# Patient Record
Sex: Male | Born: 1950 | Race: White | Hispanic: No | Marital: Married | Smoking: Current every day smoker
Health system: Southern US, Community
[De-identification: ages and names within clinical notes are randomized; demographics above are authoritative.]

## PROBLEM LIST (undated history)

## (undated) DIAGNOSIS — R011 Cardiac murmur, unspecified: Secondary | ICD-10-CM

## (undated) DIAGNOSIS — I350 Nonrheumatic aortic (valve) stenosis: Secondary | ICD-10-CM

## (undated) DIAGNOSIS — F419 Anxiety disorder, unspecified: Secondary | ICD-10-CM

## (undated) DIAGNOSIS — Z9289 Personal history of other medical treatment: Secondary | ICD-10-CM

## (undated) DIAGNOSIS — R0602 Shortness of breath: Secondary | ICD-10-CM

## (undated) DIAGNOSIS — E079 Disorder of thyroid, unspecified: Secondary | ICD-10-CM

## (undated) DIAGNOSIS — R059 Cough, unspecified: Secondary | ICD-10-CM

## (undated) DIAGNOSIS — F1010 Alcohol abuse, uncomplicated: Secondary | ICD-10-CM

## (undated) DIAGNOSIS — E7800 Pure hypercholesterolemia, unspecified: Secondary | ICD-10-CM

## (undated) DIAGNOSIS — R05 Cough: Secondary | ICD-10-CM

## (undated) DIAGNOSIS — I10 Essential (primary) hypertension: Secondary | ICD-10-CM

## (undated) DIAGNOSIS — D759 Disease of blood and blood-forming organs, unspecified: Secondary | ICD-10-CM

## (undated) HISTORY — DX: Nonrheumatic aortic (valve) stenosis: I35.0

## (undated) HISTORY — DX: Personal history of other medical treatment: Z92.89

## (undated) HISTORY — PX: THYROIDECTOMY, PARTIAL: SHX18

## (undated) HISTORY — PX: FRACTURE SURGERY: SHX138

## (undated) HISTORY — PX: CARDIAC VALVE REPLACEMENT: SHX585

## (undated) HISTORY — DX: Disorder of thyroid, unspecified: E07.9

---
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
## 2010-03-13 ENCOUNTER — Other Ambulatory Visit (HOSPITAL_COMMUNITY): Payer: Self-pay | Admitting: Family Medicine

## 2010-03-13 ENCOUNTER — Other Ambulatory Visit: Payer: Self-pay | Admitting: Family Medicine

## 2010-03-13 ENCOUNTER — Ambulatory Visit (HOSPITAL_COMMUNITY)
Admission: RE | Admit: 2010-03-13 | Discharge: 2010-03-13 | Disposition: A | Discharge status: Home / Self Care | Payer: 59 | Source: Ambulatory Visit | Attending: Family Medicine | Admitting: Family Medicine

## 2010-03-13 DIAGNOSIS — R221 Localized swelling, mass and lump, neck: Secondary | ICD-10-CM

## 2010-03-13 DIAGNOSIS — R220 Localized swelling, mass and lump, head: Secondary | ICD-10-CM | POA: Insufficient documentation

## 2010-03-14 ENCOUNTER — Ambulatory Visit (HOSPITAL_COMMUNITY)
Admission: RE | Admit: 2010-03-14 | Discharge: 2010-03-14 | Disposition: A | Discharge status: Home / Self Care | Payer: 59 | Source: Ambulatory Visit | Attending: Family Medicine | Admitting: Family Medicine

## 2010-03-14 DIAGNOSIS — E041 Nontoxic single thyroid nodule: Secondary | ICD-10-CM | POA: Insufficient documentation

## 2010-03-14 DIAGNOSIS — R221 Localized swelling, mass and lump, neck: Secondary | ICD-10-CM

## 2010-03-14 DIAGNOSIS — R220 Localized swelling, mass and lump, head: Secondary | ICD-10-CM | POA: Insufficient documentation

## 2010-03-14 MED ORDER — IOHEXOL 300 MG/ML  SOLN
100.0000 mL | Freq: Once | INTRAMUSCULAR | Status: AC | PRN
  Administered 2010-03-14: 100 mL via INTRAVENOUS

## 2010-03-16 ENCOUNTER — Other Ambulatory Visit: Payer: Self-pay | Admitting: Otolaryngology

## 2010-03-16 ENCOUNTER — Other Ambulatory Visit (HOSPITAL_COMMUNITY)
Admission: RE | Admit: 2010-03-16 | Discharge: 2010-03-16 | Disposition: A | Discharge status: Home / Self Care | Payer: 59 | Source: Ambulatory Visit | Attending: Otolaryngology | Admitting: Otolaryngology

## 2010-03-16 DIAGNOSIS — E049 Nontoxic goiter, unspecified: Secondary | ICD-10-CM | POA: Insufficient documentation

## 2010-03-21 ENCOUNTER — Other Ambulatory Visit: Payer: Self-pay | Admitting: Otolaryngology

## 2010-03-21 DIAGNOSIS — E041 Nontoxic single thyroid nodule: Secondary | ICD-10-CM

## 2010-03-27 ENCOUNTER — Ambulatory Visit (HOSPITAL_COMMUNITY)
Admission: RE | Admit: 2010-03-27 | Discharge: 2010-03-27 | Disposition: A | Discharge status: Home / Self Care | Payer: 59 | Source: Ambulatory Visit | Attending: Otolaryngology | Admitting: Otolaryngology

## 2010-03-27 ENCOUNTER — Other Ambulatory Visit: Payer: Self-pay | Admitting: Interventional Radiology

## 2010-03-27 DIAGNOSIS — E041 Nontoxic single thyroid nodule: Secondary | ICD-10-CM | POA: Insufficient documentation

## 2010-04-13 ENCOUNTER — Encounter (HOSPITAL_COMMUNITY)
Admission: RE | Admit: 2010-04-13 | Discharge: 2010-04-13 | Disposition: A | Discharge status: Home / Self Care | Payer: 59 | Source: Ambulatory Visit | Attending: Otolaryngology | Admitting: Otolaryngology

## 2010-04-13 LAB — CBC
Platelets: 219 10*3/uL (ref 150–400)
RBC: 5.17 MIL/uL (ref 4.22–5.81)
WBC: 9.4 10*3/uL (ref 4.0–10.5)

## 2010-04-13 LAB — SURGICAL PCR SCREEN: Staphylococcus aureus: NEGATIVE

## 2010-04-19 ENCOUNTER — Observation Stay (HOSPITAL_COMMUNITY)
Admission: RE | Admit: 2010-04-19 | Discharge: 2010-04-20 | Disposition: A | Discharge status: Home / Self Care | Payer: 59 | Source: Ambulatory Visit | Attending: Otolaryngology | Admitting: Otolaryngology

## 2010-04-19 ENCOUNTER — Other Ambulatory Visit: Payer: Self-pay | Admitting: Otolaryngology

## 2010-04-19 DIAGNOSIS — Z01812 Encounter for preprocedural laboratory examination: Secondary | ICD-10-CM | POA: Insufficient documentation

## 2010-04-19 DIAGNOSIS — E042 Nontoxic multinodular goiter: Principal | ICD-10-CM | POA: Insufficient documentation

## 2010-04-24 NOTE — Op Note (Signed)
Benjamin Spears, Benjamin Spears             ACCOUNT NO.:  [id number]  MEDICAL RECORD NO.:  [id number]           PATIENT TYPE:  O  LOCATION:  5114                         FACILITY:  [facility]  PHYSICIAN:  Benjamin Kathan H. Pollyann Kennedy, MD     DATE OF BIRTH:  1950/03/21  DATE OF PROCEDURE:  04/19/2010 DATE OF DISCHARGE:                              OPERATIVE REPORT   REFERRING PHYSICIAN:  Francis P. Modesto Charon, MD  PREOPERATIVE DIAGNOSIS:  Left thyroid mass.  POSTOPERATIVE DIAGNOSIS:  Left thyroid mass.  PROCEDURE:  Left thyroid lobectomy.  SURGEON:  Benjamin Sherard H. Pollyann Kennedy, MD  ASSISTANT:  Benjamin Spears, [facility]  ANESTHESIA:  General endotracheal anesthesia was used.  COMPLICATIONS:  None.  BLOOD LOSS:  500 mL.  FINDINGS:  Large multilobulated left thyroid lobe with dominant nodule. Frozen section evaluation, no papillary cancer.  Further diagnosis deferred for permanent evaluation.  No complications.  HISTORY:  This is a 60 year old with a lump in the right neck that turned out to be a parotid mass for which she went and had an ultrasound and CT of the neck which then revealed an incidental 3-cm calcified lesion in the left thyroid.  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  In the interim, the parotid mass had disappeared after being treated with antibiotics. The thyroid mass persists.  Risks, benefits, alternatives, and complications of the procedure were explained to the patient, seemed to understand, and agreed to surgery.  PROCEDURE:  The patient was taken to the operating room, placed on the operating table in supine position.  Following induction of general endotracheal anesthesia, the neck was prepped and draped in standard fashion.  Thorough palpation of the right parotid revealed no palpable mass.  A low collar incision was outlined with a marking pen about 6 cm in length and was incised just above the clavicle and sternal notch with an electrocautery  unit.  Dissection was carried down through the superficial fascia and the platysma muscle.  Subplatysmal flaps were elevated superiorly and inferiorly to the thyroid notch and the clavicle.  Self-retaining thyroid retractor was used through the rest of the case.  The midline fascia was divided and the diastasis of the strap muscles was identified.  The left-sided strap muscles were reflected off laterally while the thyroid gland left lobe was exposed.  A combination of Babcock and Allis forceps were used to retract the left lobe medially.  Dissection was started in the superior pole where the superior vasculature was separately identified, ligated between clamps, and divided.  The dissection remained on the capsule of the gland the entire time.  Parathyroid glands were not identified.  The middle thyroid vein was divided between clamps and ligated with silk suture as well.  As the grand was brought forward, the recurrent nerve was identified and was left unmolested.  The inferior vasculature was then also separately ligated between clamps and divided.  A 4-0 silk ties were used throughout the case.  The bipolar cautery was used as needed for maintenance of hemostasis.  Berry ligament was divided using electrocautery, and the gland was brought off the face of the  trachea. The isthmus was divided on the right of the midline using electrocautery.  Ties were used on larger vessels.  The left lobe was sent for pathologic evaluation as described above.  The wound was irrigated with saline.  Hemostasis was completed.  The wound was closed in layers using 4-0 chromic on the midline fascia of the platysma layer and the subcutaneous layer.  Dermabond was used on the skin.  A 7-French round JP drain was left in the wound, exited through the right side of the incision, secured in place with a nylon suture.  The patient was awakened, extubated, and transferred to recovery in stable  condition.     Benjamin Spears H. Pollyann Kennedy, MD     JHR/MEDQ  D:  04/19/2010  T:  04/20/2010  Job:  [id number]  Electronically Signed by Benjamin Colonel MD on 04/24/2010 08:11:53 PM

## 2013-01-19 IMAGING — CT CT NECK W/ CM
3 series · 13 of 20 positions shown, 15 images · IV contrast (omnipaque)
Comparison: None

CLINICAL DATA: Right neck mass.

CT NECK WITH CONTRAST
TECHNIQUE: Multidetector CT imaging of the neck was performed with
intravenous contrast.
Contrast: 100 ml Omnipaque 300 IV.

[Series 2: neck rtn st · axial · 0.39mm/px · z∈[+854,+1030]mm · 5 of 89 slices shown]
[im 15/89  bone]
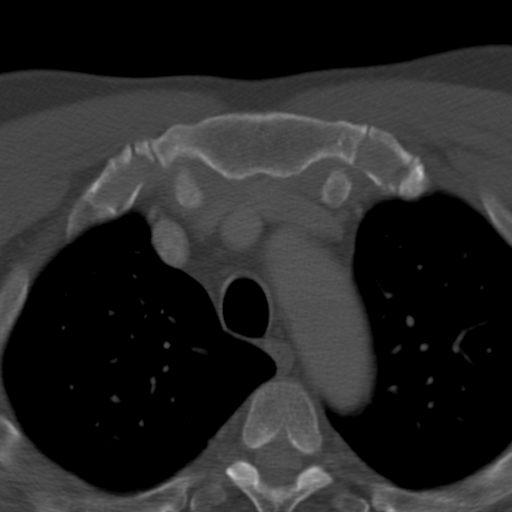
[im 30/89  bone]
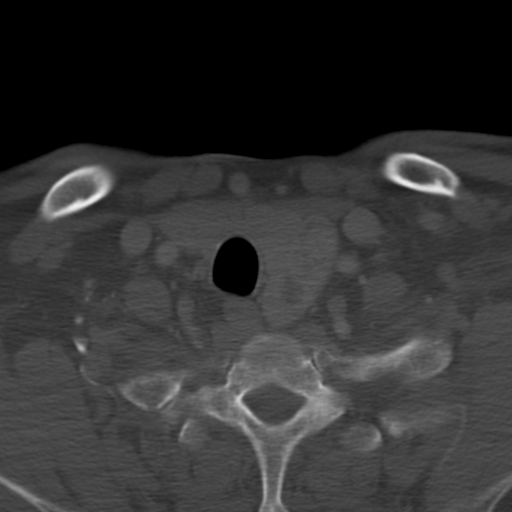
[im 45/89  bone]
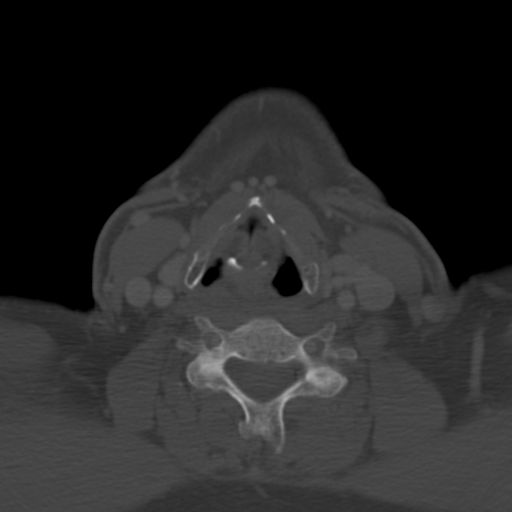
[im 59/89  bone]
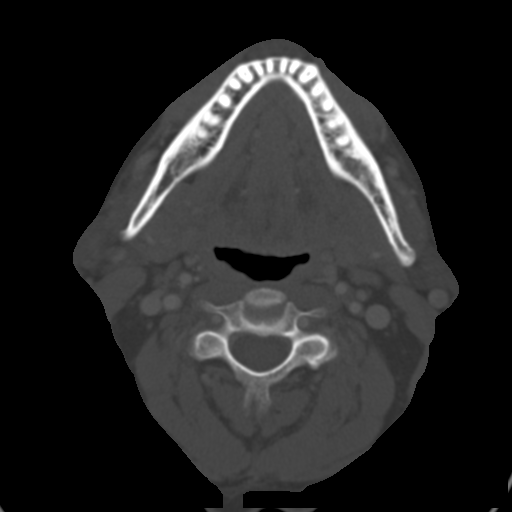
[im 74/89  bone]
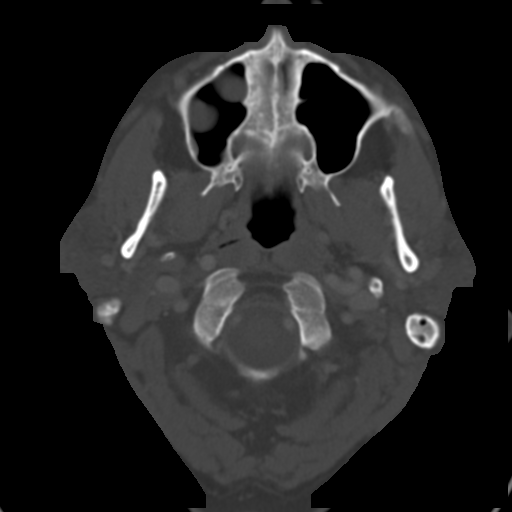

[Series 602: <mpr thick range> · coronal · 0.52mm/px · 3 of 66 slices shown]
[im 15/66  bone]
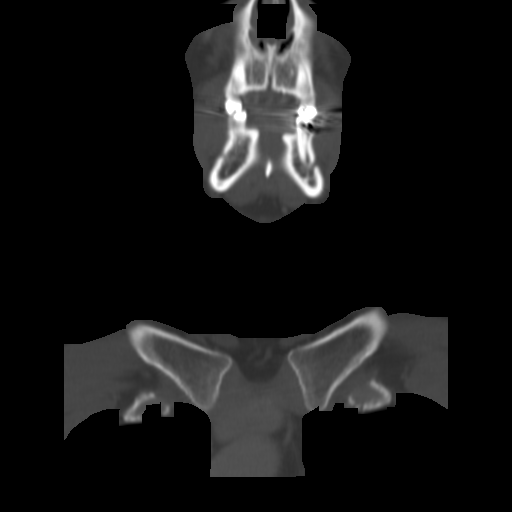
[im 27/66  bone]
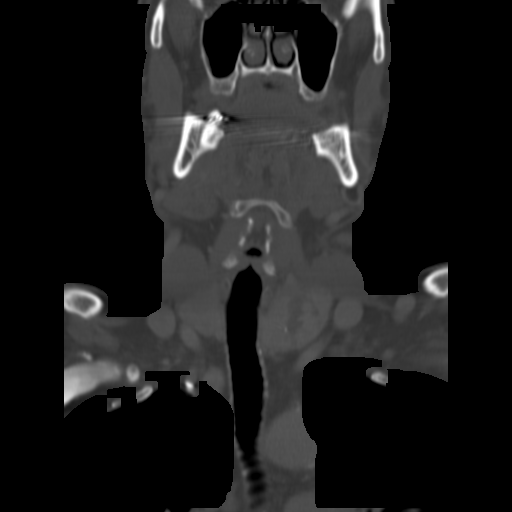
[im 39/66  bone]
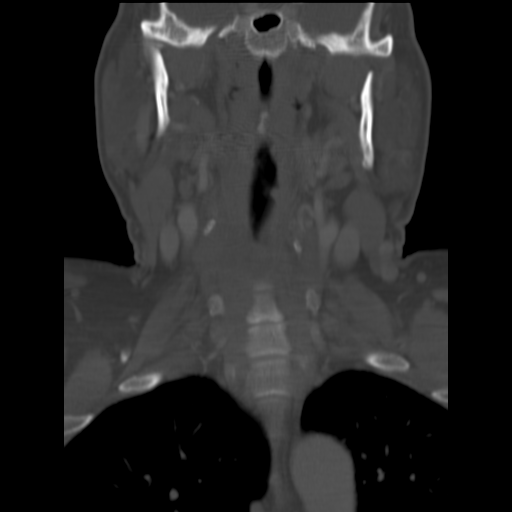

[Series 603: <mpr thick range(1)> · axial · 0.52mm/px · z∈[+812,+963]mm · 5 of 79 slices shown, 7 images]
[im 14/79  soft-tissue]
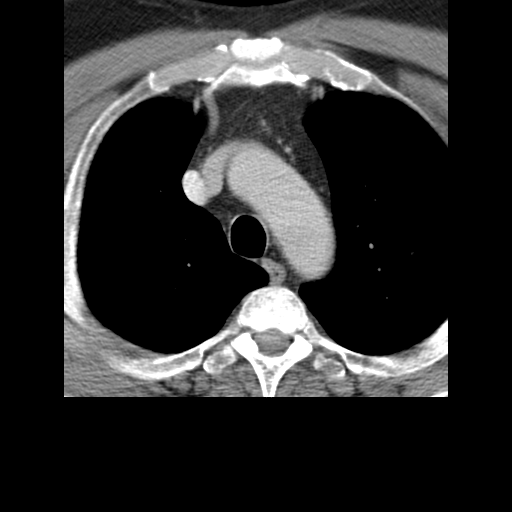
[im 14/79  bone]
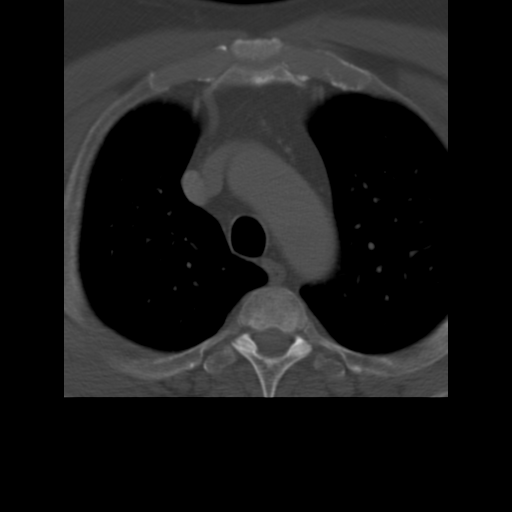
[im 27/79  bone]
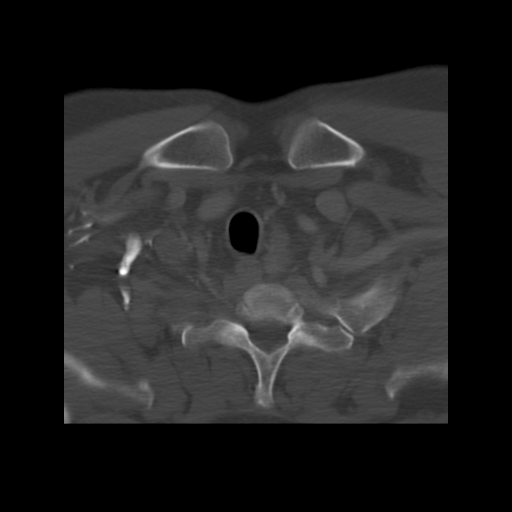
[im 40/79  bone]
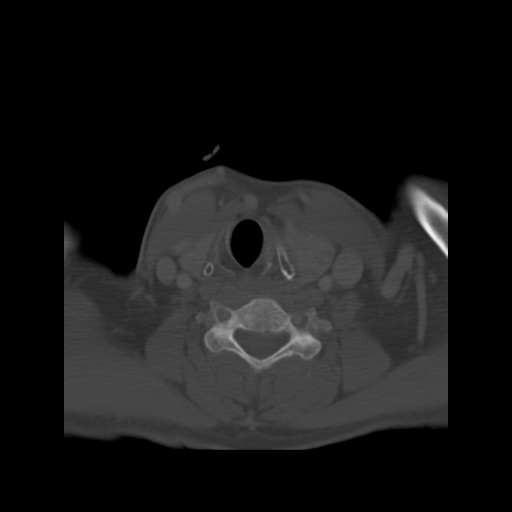
[im 53/79  bone]
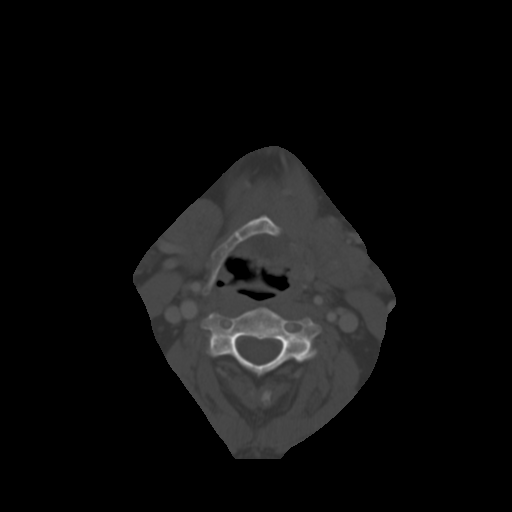
[im 66/79  soft-tissue]
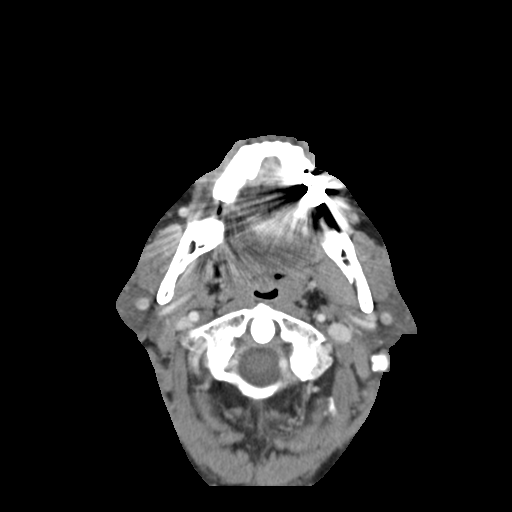
[im 66/79  bone]
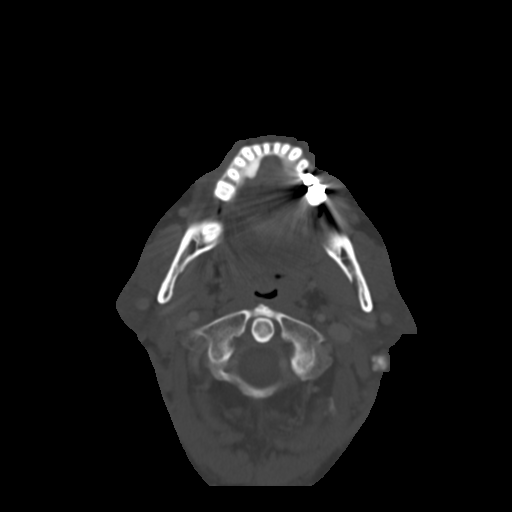

[13 of 20 positions shown; findings below may reference images not displayed]

FINDINGS: No neck masses are identified.  Parotid glands and
submandibular glands are symmetric and unremarkable.  Minimal
atherosclerotic plaque in the carotid bulb without stenosis.
Jugular veins are patent.  Retropharyngeal soft tissues are normal.
Airway is patent.  Scattered small lymph nodes, none pathologically
enlarged.

3 cm irregular complex mass is seen within the left thyroid lobe
containing internal calcifications.  Right thyroid lobe is
unremarkable.

Mucoperiosteal thickening in the right maxillary sinus.  Remainder
the paranasal sinuses are clear.  No air fluid levels.  Visualized
orbital soft tissues are unremarkable.
IMPRESSION: No abnormality seen within the neck to explain the palpable right
neck mass.

Left thyroid nodule or nodules containing calcifications.  This
could be further characterized with dedicated thyroid ultrasound.

Minimal chronic sinusitis changes.

## 2013-05-27 ENCOUNTER — Ambulatory Visit (INDEPENDENT_AMBULATORY_CARE_PROVIDER_SITE_OTHER): Payer: 59

## 2013-05-27 ENCOUNTER — Encounter (INDEPENDENT_AMBULATORY_CARE_PROVIDER_SITE_OTHER): Payer: Self-pay

## 2013-05-27 ENCOUNTER — Ambulatory Visit (INDEPENDENT_AMBULATORY_CARE_PROVIDER_SITE_OTHER): Payer: 59 | Admitting: Physician Assistant

## 2013-05-27 ENCOUNTER — Encounter: Payer: Self-pay | Admitting: Physician Assistant

## 2013-05-27 VITALS — BP 166/88 | HR 58 | Temp 97.7°F | Ht 74.0 in | Wt 249.0 lb

## 2013-05-27 DIAGNOSIS — Z0000 Encounter for general adult medical examination without abnormal findings: Secondary | ICD-10-CM

## 2013-05-27 DIAGNOSIS — R011 Cardiac murmur, unspecified: Secondary | ICD-10-CM | POA: Insufficient documentation

## 2013-05-27 NOTE — Progress Notes (Signed)
Subjective:     Patient ID: Benjamin Spears, male   DOB: 01/21/1950, 63 y.o.   MRN: [id number]  HPI Pt here for PE  It has been extended amt of time since his prev PE   Review of Systems  Constitutional: Positive for activity change and fatigue.  HENT: Negative.   Eyes: Positive for visual disturbance.  Respiratory: Positive for cough. Negative for apnea, chest tightness, shortness of breath and wheezing.   Cardiovascular: Negative.   Gastrointestinal: Negative.   Endocrine: Negative.   Genitourinary: Negative.   Musculoskeletal: Positive for arthralgias and back pain.  Skin: Negative.   Psychiatric/Behavioral: Positive for agitation. Negative for hallucinations, behavioral problems, confusion, sleep disturbance and self-injury. The patient is not nervous/anxious and is not hyperactive.   All other systems reviewed and are negative.      Objective:   Physical Exam  Nursing note and vitals reviewed. Constitutional: He is oriented to person, place, and time. He appears well-developed and well-nourished.  HENT:  Head: Normocephalic and atraumatic.  Right Ear: External ear normal.  Left Ear: External ear normal.  Nose: Nose normal.  Mouth/Throat: Oropharynx is clear and moist.  Eyes: Conjunctivae and EOM are normal. Pupils are equal, round, and reactive to light.  Neck: Normal range of motion. Neck supple.  Cardiovascular: Normal rate, regular rhythm and intact distal pulses.   Murmur heard. Sys ejection murmur 3/6  Pulmonary/Chest: Effort normal and breath sounds normal. He has no wheezes. He has no rales.  Abdominal: Soft. Bowel sounds are normal. He exhibits no distension and no mass. There is no tenderness. There is no guarding.  Genitourinary: Penis normal.  Musculoskeletal: Normal range of motion.  Neurological: He is alert and oriented to person, place, and time. He has normal reflexes.  Skin: Skin is warm and dry.  Psychiatric: He has a normal mood and affect. His  behavior is normal. Judgment and thought content normal.  EKG- see labs CXR done today due to 2ppd smoking show chronic changes     Assessment:     Physical exam    Plan:     Full labs pending to include TSH since prev part thyroidectomy Test level due to fatigue Echo due to murmur Smoking cessation discussed Pt due for colonoscopy given FH Will inform of lab results F/U pending labs

## 2013-05-27 NOTE — Patient Instructions (Signed)

## 2013-05-28 LAB — BMP8+EGFR
BUN / CREAT RATIO: 22 (ref 10–22)
BUN: 24 mg/dL (ref 8–27)
CALCIUM: 9.5 mg/dL (ref 8.6–10.2)
CO2: 21 mmol/L (ref 18–29)
CREATININE: 1.1 mg/dL (ref 0.76–1.27)
Chloride: 101 mmol/L (ref 97–108)
GFR, EST AFRICAN AMERICAN: 84 mL/min/{1.73_m2} (ref >59)
GFR, EST NON AFRICAN AMERICAN: 73 mL/min/{1.73_m2} (ref >59)
Glucose: 97 mg/dL (ref 65–99)
POTASSIUM: 4.5 mmol/L (ref 3.5–5.2)
SODIUM: 138 mmol/L (ref 134–144)

## 2013-05-28 LAB — PSA, TOTAL AND FREE
PSA FREE: 0.11 ng/mL
PSA, Free Pct: 36.7 %
PSA: 0.3 ng/mL (ref 0.0–4.0)

## 2013-05-28 LAB — HEPATIC FUNCTION PANEL
ALBUMIN: 4.8 g/dL (ref 3.6–4.8)
ALK PHOS: 76 IU/L (ref 39–117)
ALT: 21 IU/L (ref 0–44)
AST: 19 IU/L (ref 0–40)
Bilirubin, Direct: 0.25 mg/dL (ref 0.00–0.40)
Total Bilirubin: 1 mg/dL (ref 0.0–1.2)
Total Protein: 6.6 g/dL (ref 6.0–8.5)

## 2013-05-28 LAB — THYROID PANEL WITH TSH
Free Thyroxine Index: 1.5 (ref 1.2–4.9)
T3 UPTAKE RATIO: 29 % (ref 24–39)
T4, Total: 5.1 ug/dL (ref 4.5–12.0)
TSH: 1.88 u[IU]/mL (ref 0.450–4.500)

## 2013-05-28 LAB — LIPID PANEL
CHOL/HDL RATIO: 3.3 ratio (ref 0.0–5.0)
Cholesterol, Total: 186 mg/dL (ref 100–199)
HDL: 57 mg/dL (ref >39)
LDL CALC: 99 mg/dL (ref 0–99)
Triglycerides: 151 mg/dL — ABNORMAL HIGH (ref 0–149)
VLDL CHOLESTEROL CAL: 30 mg/dL (ref 5–40)

## 2013-05-28 LAB — TESTOSTERONE,FREE AND TOTAL
TESTOSTERONE FREE: 6.6 pg/mL (ref 6.6–18.1)
Testosterone: 272 ng/dL — ABNORMAL LOW (ref 348–1197)

## 2013-06-05 ENCOUNTER — Encounter: Payer: Self-pay | Admitting: Internal Medicine

## 2013-06-12 ENCOUNTER — Ambulatory Visit (HOSPITAL_COMMUNITY): Payer: 59 | Attending: Cardiology | Admitting: Radiology

## 2013-06-12 DIAGNOSIS — I10 Essential (primary) hypertension: Secondary | ICD-10-CM | POA: Insufficient documentation

## 2013-06-12 DIAGNOSIS — R011 Cardiac murmur, unspecified: Secondary | ICD-10-CM | POA: Insufficient documentation

## 2013-06-12 DIAGNOSIS — I059 Rheumatic mitral valve disease, unspecified: Secondary | ICD-10-CM | POA: Insufficient documentation

## 2013-06-12 DIAGNOSIS — R5383 Other fatigue: Secondary | ICD-10-CM

## 2013-06-12 DIAGNOSIS — R5381 Other malaise: Secondary | ICD-10-CM | POA: Insufficient documentation

## 2013-06-12 DIAGNOSIS — I350 Nonrheumatic aortic (valve) stenosis: Secondary | ICD-10-CM

## 2013-06-12 NOTE — Progress Notes (Signed)
Echocardiogram performed.  

## 2013-06-16 NOTE — Addendum Note (Signed)
Addended by: Inis Sizer on: 06/16/2013 03:37 PM   Modules accepted: Orders

## 2013-06-17 ENCOUNTER — Other Ambulatory Visit: Payer: Self-pay

## 2013-06-17 DIAGNOSIS — I350 Nonrheumatic aortic (valve) stenosis: Secondary | ICD-10-CM

## 2013-06-17 DIAGNOSIS — R011 Cardiac murmur, unspecified: Secondary | ICD-10-CM

## 2013-06-30 ENCOUNTER — Encounter: Payer: Self-pay | Admitting: Cardiovascular Disease

## 2013-06-30 ENCOUNTER — Ambulatory Visit (INDEPENDENT_AMBULATORY_CARE_PROVIDER_SITE_OTHER): Payer: 59 | Admitting: Cardiovascular Disease

## 2013-06-30 VITALS — BP 130/90 | HR 70 | Ht 74.0 in | Wt 242.0 lb

## 2013-06-30 DIAGNOSIS — F17200 Nicotine dependence, unspecified, uncomplicated: Secondary | ICD-10-CM

## 2013-06-30 DIAGNOSIS — Z720 Tobacco use: Secondary | ICD-10-CM

## 2013-06-30 DIAGNOSIS — I350 Nonrheumatic aortic (valve) stenosis: Secondary | ICD-10-CM | POA: Insufficient documentation

## 2013-06-30 DIAGNOSIS — I359 Nonrheumatic aortic valve disorder, unspecified: Secondary | ICD-10-CM

## 2013-06-30 NOTE — Progress Notes (Signed)
History of Present Illness: 63 yo male with history of tobacco abuse (ongoing), thyroid mass s/p thyroidectomy and new diagnosis of aortic valve stenosis here today as a new patient for evaluation of aortic stenosis. He tells me that he is very active. 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 He has no prior cardiac conditions. He has had no chest pain. He does not more frequent fatigue with exertion. He had one episode of severe dizziness with pre-syncope several weeks ago. No dyspnea. He is a current everyday smoker, 2ppd. No evidence of LE edema, orthopnea.   Primary Care Physician: Rudi HeapDonald Moore  Last Lipid Profile:Lipid Panel     Component Value Date/Time   TRIG 151* 05/27/2013 1020   HDL 57 05/27/2013 1020   CHOLHDL 3.3 05/27/2013 1020   LDLCALC 99 05/27/2013 1020    Past Medical History  Diagnosis Date  . Thyroid disease   . Aortic stenosis     Past Surgical History  Procedure Laterality Date  . Thyroidectomy, partial      No current outpatient prescriptions on file.   No current facility-administered medications for this visit.    No Known Allergies  History   Social History  . Marital Status: Married    Spouse Name: N/A    Number of Children: N/A  . Years of Education: N/A   Occupational History  . 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 Smoking status: Current Every Day Smoker -- 2.00 packs/day  . Smokeless tobacco: Never Used  . Alcohol Use: 7.2 oz/week    12 Cans of beer per week  . Drug Use: No  . Sexual Activity: Not on file   Other Topics Concern  . Not on file   Social History Narrative  . No narrative on file    Family History  Problem Relation Age of Onset  . Heart disease Mother     CHF  . Atrial fibrillation Mother   . Cancer Mother     colon    Review of Systems:  As stated in the HPI and otherwise negative.   BP 130/90  Pulse 70  Ht 6\' 2"  (1.88 m)  Wt 242 lb (109.77 kg)  BMI  31.06 kg/m2  Physical Examination: General: Well developed, well nourished, NAD HEENT: OP clear, mucus membranes moist SKIN: warm, dry. No rashes. Neuro: No focal deficits Musculoskeletal: Muscle strength 5/5 all ext Psychiatric: Mood and affect normal Neck: No JVD, no carotid bruits, no thyromegaly, no lymphadenopathy. Lungs:Clear bilaterally, no wheezes, rhonci, crackles Cardiovascular: Regular rate and rhythm. Harsh systolic murmur.  Abdomen:Soft. Bowel sounds present. Non-tender.  Extremities: No lower extremity edema. Pulses are 2 + in the bilateral DP/PT.  EKG: Sinus brady  Echo 06/12/13: Left ventricle: The cavity size was normal. Wall thickness was increased in a pattern of mild LVH. Systolic function was normal. The estimated ejection fraction was in the range of 50% to 55%. Wall motion was normal; there were no regional wall motion abnormalities. Doppler parameters are consistent with abnormal left ventricular relaxation (grade 1 diastolic dysfunction). - Aortic valve: Valve mobility was restricted. There was severe stenosis. There was trivial regurgitation. Valve area (VTI): 0.76 cm^2. Valve area (Vmax): 0.83 cm^2. - Aortic root: The aortic root was mildly dilated. - Mitral valve: There was mild regurgitation. - Left atrium: The atrium was moderately to severely dilated. Impressions:  - Normal LV function; calcified aortic valve; no short  axis views obtained; severe AS with mean gradient of 62 mmHg.  Assessment and Plan:   1. Aortic valve stenosis: Severe aortic valve stenosis by echo June 2015. He has no symptoms. He is very active. Will arrange exercise treadmill stress test to assess for drop in BP. Will repeat echo in 6 months. I will see him back then. He will call with any changes.

## 2013-06-30 NOTE — Patient Instructions (Signed)
Your physician wants you to follow-up in:  6 months. You will receive a reminder letter in the mail two months in advance. If you don't receive a letter, please call our office to schedule the follow-up appointment.  Week or so after echo  Your physician has requested that you have an exercise tolerance test. For further information please visit [email or web address] Please also follow instruction sheet, as given.   Your physician has requested that you have an echocardiogram. Echocardiography is a painless test that uses sound waves to create images of your heart. It provides your doctor with information about the size and shape of your heart and how well your heart's chambers and valves are working. This procedure takes approximately one hour. There are no restrictions for this procedure. To be done in 6 months.

## 2013-07-23 ENCOUNTER — Telehealth (HOSPITAL_COMMUNITY): Payer: Self-pay

## 2013-07-23 NOTE — Telephone Encounter (Signed)
Encounter complete. 

## 2013-07-24 ENCOUNTER — Telehealth (HOSPITAL_COMMUNITY): Payer: Self-pay

## 2013-07-24 NOTE — Telephone Encounter (Signed)
Encounter complete. 

## 2013-07-28 ENCOUNTER — Ambulatory Visit (HOSPITAL_COMMUNITY)
Admission: RE | Admit: 2013-07-28 | Discharge: 2013-07-28 | Disposition: A | Discharge status: Home / Self Care | Payer: 59 | Source: Ambulatory Visit | Attending: Cardiovascular Disease | Admitting: Cardiovascular Disease

## 2013-07-28 DIAGNOSIS — I350 Nonrheumatic aortic (valve) stenosis: Secondary | ICD-10-CM

## 2013-07-28 DIAGNOSIS — I359 Nonrheumatic aortic valve disorder, unspecified: Secondary | ICD-10-CM | POA: Insufficient documentation

## 2013-07-28 NOTE — Procedures (Signed)
Exercise Treadmill Test   Test  Exercise Tolerance Test Ordering MD: Verne Carrowhristopher McAlhany, MD  Interpreting MD:   Unique Test No: 1  Treadmill:  1  Indication for ETT: chest pain - rule out ischemia  Contraindication to ETT: Yes   Stress Modality: exercise - treadmill  Cardiac Imaging Performed: non   Protocol: standard Bruce - maximal  Max BP: 181/103  Max MPHR (bpm):  159 85% MPR (bpm):  135  MPHR obtained (bpm): 139 % MPHR obtained:  87  Reached 85% MPHR (min:sec): 9:15 Total Exercise Time (min-sec): 9:34  Workload in METS:  11.0 Borg Scale:   Reason ETT Terminated:  fatigue    ST Segment Analysis At Rest: normal ST segments - no evidence of significant ST depression With Exercise: borderline ST changes  Other Information Arrhythmia:  PVC's Angina during ETT:  absent (0) Quality of ETT:  diagnostic  ETT Interpretation:  borderline (indeterminate) with non-specific ST changes  Comments: 1-2 mm Horizontal to upsloping ST depression inferiorly at peak stress Good exercise tolerance Diastolic hypertension with exercise PVC's noted throughout the study Duke treadmill score -1 Clinical correlation advised - further testing may be necessary  Chrystie NoseKenneth C. Hilty, MD, [facility] HospitalFACC Attending Cardiologist [facility]

## 2013-07-30 ENCOUNTER — Telehealth: Payer: Self-pay | Admitting: Cardiovascular Disease

## 2013-07-30 NOTE — Telephone Encounter (Signed)
Spoke with pt who is asking for medication to help with nervousness and stress. I told him Dr. Clifton JamesMcAlhany did not usually prescribe these medications and I asked pt to contact his primary care physician for this.

## 2013-07-30 NOTE — Telephone Encounter (Signed)
Patient stated he had just spoke with you and forgot to ask you a couple of questions please call and advise.

## 2013-08-04 ENCOUNTER — Ambulatory Visit (INDEPENDENT_AMBULATORY_CARE_PROVIDER_SITE_OTHER): Payer: 59 | Admitting: Cardiovascular Disease

## 2013-08-04 ENCOUNTER — Encounter: Payer: Self-pay | Admitting: *Deleted

## 2013-08-04 ENCOUNTER — Encounter: Payer: Self-pay | Admitting: Cardiovascular Disease

## 2013-08-04 VITALS — BP 150/90 | HR 86 | Ht 74.0 in | Wt 245.0 lb

## 2013-08-04 DIAGNOSIS — R9439 Abnormal result of other cardiovascular function study: Secondary | ICD-10-CM

## 2013-08-04 DIAGNOSIS — I350 Nonrheumatic aortic (valve) stenosis: Secondary | ICD-10-CM

## 2013-08-04 DIAGNOSIS — F17200 Nicotine dependence, unspecified, uncomplicated: Secondary | ICD-10-CM

## 2013-08-04 DIAGNOSIS — Z720 Tobacco use: Secondary | ICD-10-CM

## 2013-08-04 DIAGNOSIS — I359 Nonrheumatic aortic valve disorder, unspecified: Secondary | ICD-10-CM

## 2013-08-04 DIAGNOSIS — F411 Generalized anxiety disorder: Secondary | ICD-10-CM

## 2013-08-04 DIAGNOSIS — F419 Anxiety disorder, unspecified: Secondary | ICD-10-CM

## 2013-08-04 MED ORDER — ALPRAZOLAM 0.25 MG PO TABS: 0.2500 mg | ORAL_TABLET | Freq: Three times a day (TID) | ORAL | Status: DC | PRN

## 2013-08-04 NOTE — Progress Notes (Signed)
History of Present Illness: 63 yo male with history of tobacco abuse (ongoing), thyroid mass s/p thyroidectomy and severe aortic valve stenosis here today for cardiac follow up. I saw him as a new patient 06/30/13 for evaluation of aortic stenosis. He is a very active gentleman. 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 He has no known prior cardiac conditions. He has had no chest pain. He did describe more frequent fatigue with exertion. He had one episode of severe dizziness with pre-syncope in the beginning of June 2015. No dyspnea. He is a current everyday smoker, 2ppd. No evidence of LE edema, orthopnea. I arranged an exercise stress test to look for BP response to exercise with severe AS which seemed to be asymptomatic. Exercise stress test 07/28/13 with good exercise tolerance, no chest pain but diffuse ST depression c/w ischemia.   He is here today for follow up. Feeling well. No chest pain or SOB. Describes anxiety. Lots of stress with family.   Primary Care Physician: [name]  Last Lipid Profile:Lipid Panel     Component Value Date/Time   TRIG 151* 05/27/2013 1020   HDL 57 05/27/2013 1020   CHOLHDL 3.3 05/27/2013 1020   LDLCALC 99 05/27/2013 1020    Past Medical History  Diagnosis Date  . Thyroid disease   . Aortic stenosis     Past Surgical History  Procedure Laterality Date  . Thyroidectomy, partial      No current outpatient prescriptions on file.   No current facility-administered medications for this visit.    No Known Allergies  History   Social History  . Marital Status: Married    Spouse Name: N/A    Number of Children: N/A  . Years of Education: N/A   Occupational History  . 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 Smoking status: Current Every Day Smoker -- 2.00 packs/day  . Smokeless tobacco: Never Used  . Alcohol Use: 7.2 oz/week    12 Cans of beer per week  . Drug Use: No  . Sexual Activity: Not  on file   Other Topics Concern  . Not on file   Social History Narrative  . No narrative on file    Family History  Problem Relation Age of Onset  . Heart disease Mother     CHF  . Atrial fibrillation Mother   . Cancer Mother     colon    Review of Systems:  As stated in the HPI and otherwise negative.   There were no vitals taken for this visit.  Physical Examination: General: Well developed, well nourished, NAD HEENT: OP clear, mucus membranes moist SKIN: warm, dry. No rashes. Neuro: No focal deficits Musculoskeletal: Muscle strength 5/5 all ext Psychiatric: Mood and affect normal Neck: No JVD, no carotid bruits, no thyromegaly, no lymphadenopathy. Lungs:Clear bilaterally, no wheezes, rhonci, crackles Cardiovascular: Regular rate and rhythm. Harsh systolic murmur.  Abdomen:Soft. Bowel sounds present. Non-tender.  Extremities: No lower extremity edema. Pulses are 2 + in the bilateral DP/PT.  EKG: Sinus brady  Echo 06/12/13: Left ventricle: The cavity size was normal. Wall thickness was increased in a pattern of mild LVH. Systolic function was normal. The estimated ejection fraction was in the range of 50% to 55%. Wall motion was normal; there were no regional wall motion abnormalities. Doppler parameters are consistent with abnormal left ventricular relaxation (grade 1 diastolic dysfunction). - Aortic valve: Valve mobility was  restricted. There was severe stenosis. There was trivial regurgitation. Valve area (VTI): 0.76 cm^2. Valve area (Vmax): 0.83 cm^2. - Aortic root: The aortic root was mildly dilated. - Mitral valve: There was mild regurgitation. - Left atrium: The atrium was moderately to severely dilated. Impressions:  - Normal LV function; calcified aortic valve; no short axis views obtained; severe AS with mean gradient of 62 mmHg.  Assessment and Plan:   1. Aortic valve stenosis: Severe aortic valve stenosis by echo June 2015. Exercise stress test with  evidence of ischemia. Will plan right and left heart cath to assess aortic valve, assess pressures and exclude CAD. Will then follow with plans for assessment by CT surgery for valve replacement. Risks and benefits of cardiac cath reviewed.  Pre-cath labs week of procedure. [facility] He is leaving for vacation in 2 weeks and wishes to delay. He is asymptomatic at this time.   2. Tobacco abuse: Smoking cessation recommended.   3. Anxiety: Will give him Xanax 0.25 mg po Q6 hours prn anxiety.

## 2013-08-04 NOTE — Patient Instructions (Signed)
Your physician recommends that you schedule a follow-up appointment in:  About 8 weeks.   Your physician has requested that you have a cardiac catheterization. Cardiac catheterization is used to diagnose and/or treat various heart conditions. Doctors may recommend this procedure for a number of different reasons. The most common reason is to evaluate chest pain. Chest pain can be a symptom of coronary artery disease (CAD), and cardiac catheterization can show whether plaque is narrowing or blocking your heart's arteries. This procedure is also used to evaluate the valves, as well as measure the blood flow and oxygen levels in different parts of your heart. For further information please visit [email or web address] Please follow instruction sheet, as given.Scheduled for September 04, 2013  Your physician recommends that you return for lab work on August 24 or 25. The lab opens at 7:30 AM. You do not need to be fasting    Coronary Angiogram A coronary angiogram, also called coronary angiography, is an X-ray procedure used to look at the arteries in the heart. In this procedure, a dye (contrast dye) is injected through a long, hollow tube (catheter). The catheter is about the size of a piece of cooked spaghetti and is inserted through your groin, wrist, or arm. The dye is injected into each artery, and X-rays are then taken to show if there is a blockage in the arteries of your heart. LET [facility] CARE PROVIDER KNOW ABOUT:  Any allergies you have, including allergies to shellfish or contrast dye.   All medicines you are taking, including vitamins, herbs, eye drops, creams, and over-the-counter medicines.   Previous problems you or members of your family have had with the use of anesthetics.   Any blood disorders you have.   Previous surgeries you have had.  History of kidney problems or failure.   Other medical conditions you have. RISKS AND COMPLICATIONS  Generally, a coronary angiogram  is a safe procedure. However, problems can occur and include:  Allergic reaction to the dye.  Bleeding from the access site or other locations.  Kidney injury, especially in people with impaired kidney function.  Stroke (rare).  Heart attack (rare). BEFORE THE PROCEDURE   Do not eat or drink anything after midnight the night before the procedure or as directed by your health care provider.   Ask your health care provider about changing or stopping your regular medicines. This is especially important if you are taking diabetes medicines or blood thinners. PROCEDURE  You may be given a medicine to help you relax (sedative) before the procedure. This medicine is given through an intravenous (IV) access tube that is inserted into one of your veins.   The area where the catheter will be inserted will be washed and shaved. This is usually done in the groin but may be done in the fold of your arm (near your elbow) or in the wrist.   A medicine will be given to numb the area where the catheter will be inserted (local anesthetic).   The health care provider will insert the catheter into an artery. The catheter will be guided by using a special type of X-ray (fluoroscopy) of the blood vessel being examined.   A special dye will then be injected into the catheter, and X-rays will be taken. The dye will help to show where any narrowing or blockages are located in the heart arteries.  AFTER THE PROCEDURE   If the procedure is done through the leg, you will be kept in bed lying  flat for several hours. You will be instructed to not bend or cross your legs.  The insertion site will be checked frequently.   The pulse in your feet or wrist will be checked frequently.   Additional blood tests, X-rays, and an electrocardiogram may be done.  Document Released: 07/01/2002 Document Revised: 05/11/2013 Document Reviewed: 05/19/2012 [facility] Patient Information 2015 [employer] [place]. This  information is not intended to replace advice given to you by your health care provider. Make sure you discuss any questions you have with your health care provider.

## 2013-08-13 ENCOUNTER — Encounter: Payer: 59 | Admitting: Internal Medicine

## 2013-08-24 ENCOUNTER — Encounter (HOSPITAL_COMMUNITY): Payer: Self-pay | Admitting: Pharmacy Technician

## 2013-08-31 ENCOUNTER — Other Ambulatory Visit (INDEPENDENT_AMBULATORY_CARE_PROVIDER_SITE_OTHER): Payer: 59

## 2013-08-31 DIAGNOSIS — R9439 Abnormal result of other cardiovascular function study: Secondary | ICD-10-CM

## 2013-08-31 DIAGNOSIS — I350 Nonrheumatic aortic (valve) stenosis: Secondary | ICD-10-CM

## 2013-08-31 DIAGNOSIS — I359 Nonrheumatic aortic valve disorder, unspecified: Secondary | ICD-10-CM

## 2013-08-31 LAB — CBC
HCT: 49.1 % (ref 39.0–52.0)
HEMOGLOBIN: 16.9 g/dL (ref 13.0–17.0)
MCHC: 34.3 g/dL (ref 30.0–36.0)
MCV: 95.8 fl (ref 78.0–100.0)
PLATELETS: 203 10*3/uL (ref 150.0–400.0)
RBC: 5.13 Mil/uL (ref 4.22–5.81)
RDW: 13.8 % (ref 11.5–15.5)
WBC: 7.9 10*3/uL (ref 4.0–10.5)

## 2013-08-31 LAB — BASIC METABOLIC PANEL
BUN: 12 mg/dL (ref 6–23)
CO2: 25 mEq/L (ref 19–32)
Calcium: 9.5 mg/dL (ref 8.4–10.5)
Chloride: 104 mEq/L (ref 96–112)
Creatinine, Ser: 1.1 mg/dL (ref 0.4–1.5)
GFR: 76.27 mL/min (ref >60.00)
GLUCOSE: 87 mg/dL (ref 70–99)
POTASSIUM: 4.4 meq/L (ref 3.5–5.1)
Sodium: 137 mEq/L (ref 135–145)

## 2013-08-31 LAB — PROTIME-INR
INR: 0.9 ratio (ref 0.8–1.0)
PROTHROMBIN TIME: 9.8 s (ref 9.6–13.1)

## 2013-09-04 ENCOUNTER — Encounter (HOSPITAL_COMMUNITY)
Admission: RE | Disposition: A | Discharge status: Home / Self Care | Payer: Self-pay | Source: Ambulatory Visit | Attending: Cardiovascular Disease

## 2013-09-04 ENCOUNTER — Ambulatory Visit (HOSPITAL_COMMUNITY)
Admission: RE | Admit: 2013-09-04 | Discharge: 2013-09-04 | Disposition: A | Discharge status: Home / Self Care | Payer: 59 | Source: Ambulatory Visit | Attending: Cardiovascular Disease | Admitting: Cardiovascular Disease

## 2013-09-04 DIAGNOSIS — I2510 Atherosclerotic heart disease of native coronary artery without angina pectoris: Secondary | ICD-10-CM | POA: Insufficient documentation

## 2013-09-04 DIAGNOSIS — I359 Nonrheumatic aortic valve disorder, unspecified: Secondary | ICD-10-CM | POA: Insufficient documentation

## 2013-09-04 DIAGNOSIS — I350 Nonrheumatic aortic (valve) stenosis: Secondary | ICD-10-CM

## 2013-09-04 DIAGNOSIS — F17200 Nicotine dependence, unspecified, uncomplicated: Secondary | ICD-10-CM | POA: Insufficient documentation

## 2013-09-04 HISTORY — PX: LEFT AND RIGHT HEART CATHETERIZATION WITH CORONARY ANGIOGRAM: SHX5449

## 2013-09-04 SURGERY — LEFT AND RIGHT HEART CATHETERIZATION WITH CORONARY ANGIOGRAM
Anesthesia: LOCAL

## 2013-09-04 MED ORDER — NITROGLYCERIN 0.2 MG/ML ON CALL CATH LAB
INTRAVENOUS | Status: AC
  Filled 2013-09-04: qty 1

## 2013-09-04 MED ORDER — ASPIRIN 81 MG PO CHEW
81.0000 mg | CHEWABLE_TABLET | ORAL | Status: AC
  Administered 2013-09-04: 81 mg via ORAL

## 2013-09-04 MED ORDER — MIDAZOLAM HCL 2 MG/2ML IJ SOLN
INTRAMUSCULAR | Status: AC
  Filled 2013-09-04: qty 2

## 2013-09-04 MED ORDER — VERAPAMIL HCL 2.5 MG/ML IV SOLN
INTRAVENOUS | Status: AC
  Filled 2013-09-04: qty 2

## 2013-09-04 MED ORDER — LIDOCAINE HCL (PF) 1 % IJ SOLN
INTRAMUSCULAR | Status: AC
  Filled 2013-09-04: qty 30

## 2013-09-04 MED ORDER — SODIUM CHLORIDE 0.9 % IV SOLN
INTRAVENOUS | Status: DC
  Administered 2013-09-04: 08:00:00 via INTRAVENOUS

## 2013-09-04 MED ORDER — HEPARIN SODIUM (PORCINE) 1000 UNIT/ML IJ SOLN
INTRAMUSCULAR | Status: AC
  Filled 2013-09-04: qty 1

## 2013-09-04 MED ORDER — SODIUM CHLORIDE 0.9 % IV SOLN: 250.0000 mL | INTRAVENOUS | Status: DC | PRN

## 2013-09-04 MED ORDER — SODIUM CHLORIDE 0.9 % IV SOLN: INTRAVENOUS | Status: AC

## 2013-09-04 MED ORDER — HEPARIN (PORCINE) IN NACL 2-0.9 UNIT/ML-% IJ SOLN
INTRAMUSCULAR | Status: AC
  Filled 2013-09-04: qty 1500

## 2013-09-04 MED ORDER — ASPIRIN 81 MG PO CHEW
CHEWABLE_TABLET | ORAL | Status: AC
  Administered 2013-09-04: 81 mg via ORAL
  Filled 2013-09-04: qty 1

## 2013-09-04 MED ORDER — SODIUM CHLORIDE 0.9 % IJ SOLN: 3.0000 mL | INTRAMUSCULAR | Status: DC | PRN

## 2013-09-04 MED ORDER — FENTANYL CITRATE 0.05 MG/ML IJ SOLN
INTRAMUSCULAR | Status: AC
  Filled 2013-09-04: qty 2

## 2013-09-04 MED ORDER — SODIUM CHLORIDE 0.9 % IJ SOLN: 3.0000 mL | Freq: Two times a day (BID) | INTRAMUSCULAR | Status: DC

## 2013-09-04 NOTE — Discharge Instructions (Signed)
° °  Radial Site Care Refer to this sheet in the next few weeks. These instructions provide you with information on caring for yourself after your procedure. Your caregiver may also give you more specific instructions. Your treatment has been planned according to current medical practices, but problems sometimes occur. Call your caregiver if you have any problems or questions after your procedure. HOME CARE INSTRUCTIONS  You may shower the day after the procedure.Remove the bandage in 24 hours, you may apply a bandaid after.  Do not apply powder or lotion to the site.  Do not submerge the affected site in water for 3 to 5 days.  Inspect the site at least twice daily.  Do not flex or bend the affected arm for 24 hours.  No lifting over 5 pounds (2.3 kg) for 5 days after your procedure.  Do not drive home if you are discharged the same day of the procedure. Have someone else drive you.  You may drive 24 hours after the procedure unless otherwise instructed by your caregiver.  Do not operate machinery or power tools for 24 hours.  A responsible adult should be with you for the first 24 hours after you arrive home. What to expect:  Any bruising will usually fade within 1 to 2 weeks.  Blood that collects in the tissue (hematoma) may be painful to the touch. It should usually decrease in size and tenderness within 1 to 2 weeks. SEEK IMMEDIATE MEDICAL CARE IF:  You have unusual pain at the radial site.  You have redness, warmth, swelling, or pain at the radial site.  You have drainage (other than a small amount of blood on the dressing).  You have chills.  You have a fever or persistent symptoms for more than 72 hours.  You have a fever and your symptoms suddenly get worse.  Your arm becomes pale, cool, tingly, or numb.  You have heavy bleeding from the site. Hold pressure on the site. Call 911.  Document Released: 01/27/2010 Document Revised: 03/19/2011 Document Reviewed:  01/27/2010 [facility] Patient Information 2015 [place], [place]. This information is not intended to replace advice given to you by your health care provider. Make sure you discuss any questions you have with your health care provider.

## 2013-09-04 NOTE — H&P (Signed)
Patient ID: Benjamin Spears MRN: [id number] DOB/AGE: [birth year] 62 y.o. Admit date: 09/04/2013  Primary Cardiologist:  mcalhany  HPI:  63 yo male with history of tobacco abuse (ongoing), thyroid mass s/p thyroidectomy and severe aortic valve stenosis here today for cardiac cath. I saw him as a new patient 06/30/13 for evaluation of aortic stenosis. He is a very active gentleman. 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 He has no known prior cardiac conditions. He has had no chest pain. He did describe more frequent fatigue with exertion. He had one episode of severe dizziness with pre-syncope in the beginning of June 2015. No dyspnea. He is a current everyday smoker, 2ppd. No evidence of LE edema, orthopnea. I arranged an exercise stress test to look for BP response to exercise with severe AS which seemed to be asymptomatic. Exercise stress test 07/28/13 with good exercise tolerance, no chest pain but diffuse ST depression c/w ischemia.    Review of systems complete and found to be negative unless listed above   Past Medical History  Diagnosis Date  . Thyroid disease   . Aortic stenosis     Family History  Problem Relation Age of Onset  . Heart disease Mother     CHF  . Atrial fibrillation Mother   . Cancer Mother     colon    History   Social History  . Marital Status: Married    Spouse Name: N/A    Number of Children: N/A  . Years of Education: N/A   Occupational History  . 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 Smoking status: Current Every Day Smoker -- 2.00 packs/day  . Smokeless tobacco: Never Used  . Alcohol Use: 7.2 oz/week    12 Cans of beer per week  . Drug Use: No  . Sexual Activity: Not on file   Other Topics Concern  . Not on file   Social History Narrative  . No narrative on file    Past Surgical History  Procedure Laterality Date  . Thyroidectomy, partial      No Known Allergies  Prior to Admission  Meds:  Prescriptions prior to admission  Medication Sig Dispense Refill  . ALPRAZolam (XANAX) 0.25 MG tablet Take 1 tablet (0.25 mg total) by mouth 3 (three) times daily as needed for anxiety.  30 tablet  0    Physical Exam: Blood pressure 144/89, pulse 69, temperature 97.8 F (36.6 C), temperature source Oral, resp. rate 18, height  (1.88 m), weight 245 lb (111.131 kg), SpO2 89.00%.    General: Well developed, well nourished, NAD  HEENT: OP clear, mucus membranes moist  SKIN: warm, dry. No rashes.  Neuro: No focal deficits  Musculoskeletal: Muscle strength 5/5 all ext  Psychiatric: Mood and affect normal  Neck: No JVD, no carotid bruits, no thyromegaly, no lymphadenopathy.  Lungs:Clear bilaterally, no wheezes, rhonci, crackles  Cardiovascular: Regular rate and rhythm. Loud AS murmur. No gallops or rubs.  Abdomen:Soft. Bowel sounds present. Non-tender.  Extremities: No lower extremity edema. Pulses are 2 + in the bilateral DP/PT.   Labs:   Lab Results  Component Value Date   WBC 7.9 08/31/2013   HGB 16.9 08/31/2013   HCT 49.1 08/31/2013   MCV 95.8 08/31/2013   PLT 203.0 08/31/2013    Recent Labs Lab 08/31/13 1014  NA 137  K 4.4  CL 104  CO2 25  BUN  12  CREATININE 1.1  CALCIUM 9.5  GLUCOSE 87      ASSESSMENT AND PLAN:   1. Severe AS: Pt with known severe AS. Abnormal stress test. Presyncope. Right and left heart cath today to assess valve and exclude CAD  Earney Hamburg, MD 09/04/2013, 8:04 AM

## 2013-09-04 NOTE — Interval H&P Note (Signed)
History and Physical Interval Note:  09/04/2013 8:07 AM  Benjamin Spears  has presented today for cardiac cath with the diagnosis of aortic stenosis, positive stress test  The various methods of treatment have been discussed with the patient and family. After consideration of risks, benefits and other options for treatment, the patient has consented to  Procedure(s): LEFT AND RIGHT HEART CATHETERIZATION WITH CORONARY ANGIOGRAM (N/A) as a surgical intervention .  The patient's history has been reviewed, patient examined, no change in status, stable for surgery.  I have reviewed the patient's chart and labs.  Questions were answered to the patient's satisfaction.    Cath Lab Visit (complete for each Cath Lab visit)  Clinical Evaluation Leading to the Procedure:   ACS: No.  Non-ACS:    Anginal Classification: CCS I  Anti-ischemic medical therapy: No Therapy  Non-Invasive Test Results: No non-invasive testing performed  Prior CABG: No previous CABG        Hilton Saephan

## 2013-09-04 NOTE — Progress Notes (Signed)
Site area: right brachial vein sheath Site Prior to Removal:  Level 0 Pressure Applied For: 15 minutes Manual:   yes Patient Status During Pull:  No complications  Post Pull Site:  Level 0 Post Pull Instructions Given:  yes Post Pull Pulses Present: radial 2+ Dressing Applied:  tegaderm  Bedrest begins @ see tr band  Comments: sipping fluids

## 2013-09-04 NOTE — CV Procedure (Signed)
      Cardiac Catheterization Operative Report  Benjamin Spears [id number] 8/28/20159:08 AM [name], MD  Procedure Performed:  1. Selective Coronary Angiography 2. Right Heart Catheterization  Operator: Verne Carrow, MD  Indication:  63 yo male with history of tobacco abuse, severe AS here today for cardiac cath in planning for aortic valve replacement.                                 Procedure Details: The risks, benefits, complications, treatment options, and expected outcomes were discussed with the patient. The patient and/or family concurred with the proposed plan, giving informed consent. The patient was brought to the cath lab after IV hydration was begun and oral premedication was given. The patient was further sedated with Versed and Fentanyl. The right antecubital area was prepped and draped. A small IV antecubital IV was present. I then passed a wire into this IV and changed out for a 5 [place] Sheath. Right heart cath performed with balloon tipped catheter. Reveres Allens test positive right radial artery. Right wrist was prepped and draped. 1% lidocaine used for local anesthesia. Using the modified Seldinger access technique, a 5 French sheath was placed in the right radial artery. Standard diagnostic catheters were used to perform selective coronary angiography. I attempted to cross the aortic valve with multiple catheters and a straight wire including JR4, AL-1, AL-2, Feldman AS catheter. After 30 minutes of trying to cross the valve I was unsuccesful. I thought the risk outweighed the benefit as he has known severe AS. I then stopped the procedure. There were no immediate complications. The patient was taken to the recovery area in stable condition.   Hemodynamic Findings: Ao:   127/78           LV: could not cross valve RA:  3  RV: 26/1/6 PA: 29/5 (mean 17)     PCWP: 9 Fick Cardiac Output: 4.9 L/min Fick Cardiac Index: 2.07 L/min/m2 Central Aortic  Saturation: 94% Pulmonary Artery Saturation: 66%  Angiographic Findings:  Left main: 10% mid stenosis.   Left Anterior Descending Artery: Large caliber vessel that courses to the apex. 20% mid stenosis. Moderate caliber diagonal branch. No flow limiting lesions noted.   Circumflex Artery: Large caliber vessel with termination into a large obtuse marginal branch. 20% stenosis OM1. No flow limiting lesions noted.   Right Coronary Artery: Large dominant vessel with 20% proximal stenosis, 20% mid stenosis, 20% distal stenosis.   Left Ventricular Angiogram: Could not cross valve.   Impression: 1. Mild non-obstructive CAD 2. Normal filling pressures 3. Severe aortic valve stenosis  Recommendations: He will be discharged home today. I will contact the CT surgery office and arrange outpatient follow up to discuss aortic valve replacement given symptomatic severe AS.        Complications:  None; patient tolerated the procedure well.

## 2013-09-07 LAB — POCT I-STAT 3, ART BLOOD GAS (G3+)
Acid-base deficit: 4 mmol/L — ABNORMAL HIGH (ref 0.0–2.0)
Bicarbonate: 20.8 mEq/L (ref 20.0–24.0)
O2 Saturation: 94 %
PCO2 ART: 35.1 mmHg (ref 35.0–45.0)
PO2 ART: 70 mmHg — AB (ref 80.0–100.0)
TCO2: 22 mmol/L (ref 0–100)
pH, Arterial: 7.382 (ref 7.350–7.450)

## 2013-09-07 LAB — POCT ACTIVATED CLOTTING TIME: ACTIVATED CLOTTING TIME: 168 s

## 2013-09-07 LAB — POCT I-STAT 3, VENOUS BLOOD GAS (G3P V)
Acid-base deficit: 4 mmol/L — ABNORMAL HIGH (ref 0.0–2.0)
BICARBONATE: 20.5 meq/L (ref 20.0–24.0)
O2 Saturation: 66 %
PH VEN: 7.373 — AB (ref 7.250–7.300)
TCO2: 22 mmol/L (ref 0–100)
pCO2, Ven: 35.2 mmHg — ABNORMAL LOW (ref 45.0–50.0)
pO2, Ven: 35 mmHg (ref 30.0–45.0)

## 2013-09-16 ENCOUNTER — Institutional Professional Consult (permissible substitution) (INDEPENDENT_AMBULATORY_CARE_PROVIDER_SITE_OTHER): Payer: 59 | Admitting: Cardiothoracic Surgery

## 2013-09-16 ENCOUNTER — Other Ambulatory Visit: Payer: Self-pay | Admitting: Cardiothoracic Surgery

## 2013-09-16 ENCOUNTER — Encounter: Payer: Self-pay | Admitting: Cardiothoracic Surgery

## 2013-09-16 VITALS — BP 160/90 | HR 54 | Resp 20 | Ht 74.0 in | Wt 240.0 lb

## 2013-09-16 DIAGNOSIS — I359 Nonrheumatic aortic valve disorder, unspecified: Secondary | ICD-10-CM

## 2013-09-16 DIAGNOSIS — I350 Nonrheumatic aortic (valve) stenosis: Secondary | ICD-10-CM

## 2013-09-16 DIAGNOSIS — J449 Chronic obstructive pulmonary disease, unspecified: Secondary | ICD-10-CM

## 2013-09-16 NOTE — Progress Notes (Signed)
PCP is [name], MD Referring Provider is Kathleene Hazel*  Chief Complaint  Patient presents with  . Aortic Stenosis    Surgical eval for possible AVR, 2D ECHO 06/30/13, Cardiac Cath 09/04/13   patient examined echocardiogram and coronary angiogram reviewed  HPI: 63 year old Caucasian male smoker with history of heart murmur since 4-5 years ago. Intensity the murmur has increased. The patient was referred to cardiology. An echocardiogram demonstrated severe aortic stenosis without area of 0.8 and a mean gradient of 60 mm mercury. The patient has associated symptoms of decreased exercise tolerance and dyspnea on exertion. No resting symptoms. His coronary angiograms showed no significant CAD. LV function is well preserved. There is no significant MR. RV function is normal.  The patient smokes heavily-2 packs per day and drinks beer 2-3 beers daily. There's no family history of bowel disease or cardiac surgery. His mother had heart failure. Patient has new-onset hypertension and anxiety.  The patient had a partial thyroidectomy for multinodular goiter without cardiac or anesthetic complication.  Because of his severe aortic stenosis aortic valve placement has been recommended by his cardiologist.  The patient had a double extraction performed approximately 3 months ago. He was not covered with prophylactic antibiotics. Otherwise his current dental health is good.   Past Medical History  Diagnosis Date  . Thyroid disease   . Aortic stenosis     Past Surgical History  Procedure Laterality Date  . Thyroidectomy, partial      Family History  Problem Relation Age of Onset  . Heart disease Mother     CHF  . Atrial fibrillation Mother   . Cancer Mother     colon    Social History History  Substance Use Topics  . Smoking status: Current Every Day Smoker -- 2.00 packs/day  . Smokeless tobacco: Never Used  . Alcohol Use: 7.2 oz/week    12 Cans of beer per week     Current Outpatient Prescriptions  Medication Sig Dispense Refill  . ALPRAZolam (XANAX) 0.25 MG tablet Take 1 tablet (0.25 mg total) by mouth 3 (three) times daily as needed for anxiety.  30 tablet  0   No current facility-administered medications for this visit.    No Known Allergies  Review of Systems  Right-hand dominant No history thoracic trauma No history of TIA or any stroke No history of diabetes mellitus No bleeding diathesis No history of hepatitis jaundice or blood per rectum No history of sleep apnea No history recurrent pneumonia despite heavy smoking history  BP 160/90  Pulse 54  Resp 20  Ht  (1.88 m)  Wt 240 lb (108.863 kg)  BMI 30.80 kg/m2  SpO2 96% Physical Exam General appearance-well-developed well-nourished middle-aged male no acute distress accompanied by his wife HEENT normocephalic pupils equal dentition good Neck without JVD mass or bruit Thorax without deformity or tenderness, breath sounds clear Cardiac regular rhythm with grade 3/6 systolic ejection murmur no gallop Abdomen soft nontender without organomegaly no pulsatile mass Extremities without clubbing cyanosis edema or tenderness Vascular palpable pulses in all extremities Neurologic alert and oriented without focal motor deficit cranial nerves grossly intact  Diagnostic Tests: Severe aortic stenosis Catheter could not pass aortic valve at the time of LH C. Aortic valve heavily calcified  Impression: Patient needs aortic valve replacement with a bioprosthetic valve he needs to significantly reduce his smoking prior to surgery to prevent postoperative pulmonary: Complication.  Plan: Patient returns for review in early October 2 review his smoking  status. The patient knows he needs to cut way back on smoking before being scheduled for surgery. He was given refill for his Xanax for his anxiety and 2 help smoking cessation as well as lisinopril 10 mg daily for systolic hypertension.  He was told he should always take amoxicillin 2 g an hour before any dental procedure and was given a prescription for amoxicillin as well to be used as needed

## 2013-10-06 ENCOUNTER — Ambulatory Visit: Payer: 59 | Admitting: Cardiovascular Disease

## 2013-10-08 HISTORY — PX: CARDIAC CATHETERIZATION: SHX172

## 2013-10-14 ENCOUNTER — Other Ambulatory Visit: Payer: Self-pay

## 2013-10-14 ENCOUNTER — Ambulatory Visit (INDEPENDENT_AMBULATORY_CARE_PROVIDER_SITE_OTHER): Payer: 59 | Admitting: Cardiothoracic Surgery

## 2013-10-14 ENCOUNTER — Ambulatory Visit
Admission: RE | Admit: 2013-10-14 | Discharge: 2013-10-14 | Disposition: A | Discharge status: Home / Self Care | Payer: 59 | Source: Ambulatory Visit | Attending: Cardiothoracic Surgery | Admitting: Cardiothoracic Surgery

## 2013-10-14 ENCOUNTER — Encounter: Payer: Self-pay | Admitting: Cardiothoracic Surgery

## 2013-10-14 VITALS — BP 123/87 | HR 65 | Ht 76.0 in | Wt 240.0 lb

## 2013-10-14 DIAGNOSIS — I350 Nonrheumatic aortic (valve) stenosis: Secondary | ICD-10-CM

## 2013-10-14 DIAGNOSIS — J449 Chronic obstructive pulmonary disease, unspecified: Secondary | ICD-10-CM

## 2013-10-14 MED ORDER — IOHEXOL 300 MG/ML  SOLN
75.0000 mL | Freq: Once | INTRAMUSCULAR | Status: AC | PRN
  Administered 2013-10-14: 75 mL via INTRAVENOUS

## 2013-10-14 NOTE — Progress Notes (Signed)
PCP is Rudi HeapMOORE, DONALD, MD Referring Provider is Kathleene HazelMcAlhany, Christopher D*  Chief Complaint  Patient presents with  . F/U CARDIAC    4 WK F/U    HPI: The patient returns for further discussion originally diagnosed severe aortic stenosis by echocardiogram. Cardiac catheterization demonstrated normal right heart data, no significant CAD. The aortic valve could not be crossed with the catheter. The patient underwent a chest CT scan which showed no pulmonary pathology despite his heavy smoking history. The ascending aorta is not dilated. The aortic valve was heavily calcified but the aorta has minimal calcification. There is evidence of previous thyroid surgery for his multinodular goiter.  The patient is decreased his cigarette smoking 2 one pack per day. He is decreased his ear consumption to 2 beers daily.  The patient has had a productive cough of clear white phlegm without fever. No chest pain. He will be taking some Humibid. He'll be cutting his cigarette consumption to 15 cigarettes daily.   Past Medical History  Diagnosis Date  . Thyroid disease   . Aortic stenosis     Past Surgical History  Procedure Laterality Date  . Thyroidectomy, partial      Family History  Problem Relation Age of Onset  . Heart disease Mother     CHF  . Atrial fibrillation Mother   . Cancer Mother     colon    Social History History  Substance Use Topics  . Smoking status: Current Every Day Smoker -- 2.00 packs/day  . Smokeless tobacco: Never Used  . Alcohol Use: 7.2 oz/week    12 Cans of beer per week    Current Outpatient Prescriptions  Medication Sig Dispense Refill  . ALPRAZolam (XANAX) 0.25 MG tablet Take 1 tablet (0.25 mg total) by mouth 3 (three) times daily as needed for anxiety.  30 tablet  0  . lisinopril (PRINIVIL,ZESTRIL) 10 MG tablet Take 10 mg by mouth daily.       No current facility-administered medications for this visit.    No Known Allergies  Review of Systems some  sinus congestion and cough since last visit. No chest pain syncope or orthopnea  BP 123/87  Pulse 65  Ht 6\' 4"  (1.93 m)  Wt 240 lb (108.863 kg)  BMI 29.23 kg/m2 Physical Exam Alert and comfortable Carotid pulses 1+ Lungs clear Heart rhythm regular without grade 3/6 systolic ejection murmur No peripheral edema Neuro intact  Diagnostic Tests: DG scan reviewed with patient and wife  Impression: Severe aortic stenosis Patient will be scheduled for aortic valve replacement next week. He prefers a bioprosthetic valve to avoid lifelong commitment to Coumadin therapy which I feel is appropriate for his age and medical history.  Plan: Admit for aortic valve replacement on Thursday, October 15

## 2013-10-19 ENCOUNTER — Other Ambulatory Visit: Payer: Self-pay

## 2013-10-19 ENCOUNTER — Encounter (HOSPITAL_COMMUNITY): Payer: Self-pay

## 2013-10-19 MED ORDER — LISINOPRIL 10 MG PO TABS
10.0000 mg | ORAL_TABLET | Freq: Every day | ORAL | Status: DC
Start: 2013-10-19 — End: 2013-10-19

## 2013-10-20 ENCOUNTER — Encounter (HOSPITAL_COMMUNITY)
Admission: RE | Admit: 2013-10-20 | Discharge: 2013-10-20 | Disposition: A | Discharge status: Home / Self Care | Payer: 59 | Source: Ambulatory Visit | Attending: Cardiothoracic Surgery | Admitting: Cardiothoracic Surgery

## 2013-10-20 ENCOUNTER — Encounter (HOSPITAL_COMMUNITY): Payer: Self-pay

## 2013-10-20 ENCOUNTER — Ambulatory Visit (HOSPITAL_COMMUNITY)
Admission: RE | Admit: 2013-10-20 | Discharge: 2013-10-20 | Disposition: A | Discharge status: Home / Self Care | Payer: 59 | Source: Ambulatory Visit | Attending: Cardiothoracic Surgery | Admitting: Cardiothoracic Surgery

## 2013-10-20 VITALS — BP 163/89 | HR 49 | Temp 97.7°F | Resp 18 | Ht 74.0 in | Wt 246.6 lb

## 2013-10-20 DIAGNOSIS — I350 Nonrheumatic aortic (valve) stenosis: Secondary | ICD-10-CM

## 2013-10-20 DIAGNOSIS — Z01810 Encounter for preprocedural cardiovascular examination: Secondary | ICD-10-CM

## 2013-10-20 DIAGNOSIS — Z01818 Encounter for other preprocedural examination: Secondary | ICD-10-CM | POA: Diagnosis present

## 2013-10-20 HISTORY — DX: Shortness of breath: R06.02

## 2013-10-20 HISTORY — DX: Cough: R05

## 2013-10-20 HISTORY — DX: Essential (primary) hypertension: I10

## 2013-10-20 HISTORY — DX: Anxiety disorder, unspecified: F41.9

## 2013-10-20 HISTORY — DX: Disease of blood and blood-forming organs, unspecified: D75.9

## 2013-10-20 HISTORY — DX: Cough, unspecified: R05.9

## 2013-10-20 HISTORY — DX: Cardiac murmur, unspecified: R01.1

## 2013-10-20 LAB — PULMONARY FUNCTION TEST
DL/VA % pred: 82 %
DL/VA: 3.94 ml/min/mmHg/L
DLCO cor % pred: 71 %
DLCO cor: 26.05 ml/min/mmHg
DLCO unc % pred: 76 %
DLCO unc: 27.71 ml/min/mmHg
FEF 25-75 Post: 2.68 L/sec
FEF 25-75 Pre: 2.78 L/sec
FEF2575-%Change-Post: -3 %
FEF2575-%Pred-Post: 84 %
FEF2575-%Pred-Pre: 87 %
FEV1-%Change-Post: 7 %
FEV1-%Pred-Post: 83 %
FEV1-%Pred-Pre: 77 %
FEV1-Post: 3.31 L
FEV1-Pre: 3.08 L
FEV1FVC-%Change-Post: 17 %
FEV1FVC-%Pred-Pre: 89 %
FEV6-%Change-Post: -7 %
FEV6-%Pred-Post: 83 %
FEV6-%Pred-Pre: 90 %
FEV6-Post: 4.19 L
FEV6-Pre: 4.54 L
FEV6FVC-%Change-Post: 0 %
FEV6FVC-%Pred-Post: 103 %
FEV6FVC-%Pred-Pre: 103 %
FVC-%Change-Post: -8 %
FVC-%Pred-Post: 80 %
FVC-%Pred-Pre: 87 %
FVC-Post: 4.2 L
FVC-Pre: 4.58 L
Post FEV1/FVC ratio: 79 %
Post FEV6/FVC ratio: 100 %
Pre FEV1/FVC ratio: 67 %
Pre FEV6/FVC Ratio: 99 %
RV % pred: 111 %
RV: 2.72 L
TLC % pred: 99 %
TLC: 7.57 L

## 2013-10-20 LAB — COMPREHENSIVE METABOLIC PANEL
ALT: 28 U/L (ref 0–53)
AST: 25 U/L (ref 0–37)
Albumin: 4.1 g/dL (ref 3.5–5.2)
Alkaline Phosphatase: 73 U/L (ref 39–117)
Anion gap: 13 (ref 5–15)
BUN: 17 mg/dL (ref 6–23)
CO2: 20 mEq/L (ref 19–32)
Calcium: 9.8 mg/dL (ref 8.4–10.5)
Chloride: 103 mEq/L (ref 96–112)
Creatinine, Ser: 0.97 mg/dL (ref 0.50–1.35)
GFR calc Af Amer: >90 mL/min (ref >90)
GFR calc non Af Amer: 87 mL/min — ABNORMAL LOW (ref >90)
Glucose, Bld: 93 mg/dL (ref 70–99)
Potassium: 4.7 mEq/L (ref 3.7–5.3)
Sodium: 136 mEq/L — ABNORMAL LOW (ref 137–147)
Total Bilirubin: 0.7 mg/dL (ref 0.3–1.2)
Total Protein: 7.4 g/dL (ref 6.0–8.3)

## 2013-10-20 LAB — CBC
HCT: 47 % (ref 39.0–52.0)
Hemoglobin: 16.6 g/dL (ref 13.0–17.0)
MCH: 32.9 pg (ref 26.0–34.0)
MCHC: 35.3 g/dL (ref 30.0–36.0)
MCV: 93.1 fL (ref 78.0–100.0)
Platelets: 190 10*3/uL (ref 150–400)
RBC: 5.05 MIL/uL (ref 4.22–5.81)
RDW: 12.5 % (ref 11.5–15.5)
WBC: 6.9 10*3/uL (ref 4.0–10.5)

## 2013-10-20 LAB — BLOOD GAS, ARTERIAL
Acid-base deficit: 1.8 mmol/L (ref 0.0–2.0)
Bicarbonate: 22 mEq/L (ref 20.0–24.0)
Drawn by: 42180
FIO2: 0.21 %
O2 Saturation: 98.5 %
Patient temperature: 98.6
TCO2: 23.1 mmol/L (ref 0–100)
pCO2 arterial: 34.4 mmHg — ABNORMAL LOW (ref 35.0–45.0)
pH, Arterial: 7.422 (ref 7.350–7.450)
pO2, Arterial: 97.2 mmHg (ref 80.0–100.0)

## 2013-10-20 LAB — SURGICAL PCR SCREEN
MRSA, PCR: NEGATIVE
Staphylococcus aureus: NEGATIVE

## 2013-10-20 LAB — PROTIME-INR
INR: 0.98 (ref 0.00–1.49)
Prothrombin Time: 13 seconds (ref 11.6–15.2)

## 2013-10-20 LAB — URINALYSIS, ROUTINE W REFLEX MICROSCOPIC
Bilirubin Urine: NEGATIVE
Glucose, UA: NEGATIVE mg/dL
Hgb urine dipstick: NEGATIVE
Ketones, ur: NEGATIVE mg/dL
Nitrite: NEGATIVE
Protein, ur: NEGATIVE mg/dL
Specific Gravity, Urine: 1.012 (ref 1.005–1.030)
Urobilinogen, UA: 0.2 mg/dL (ref 0.0–1.0)
pH: 7 (ref 5.0–8.0)

## 2013-10-20 LAB — URINE MICROSCOPIC-ADD ON

## 2013-10-20 LAB — HEMOGLOBIN A1C
Hgb A1c MFr Bld: 5.1 % (ref <5.7)
Mean Plasma Glucose: 100 mg/dL (ref <117)

## 2013-10-20 LAB — APTT: aPTT: 30 seconds (ref 24–37)

## 2013-10-20 LAB — ABO/RH: ABO/RH(D): A POS

## 2013-10-20 LAB — TYPE AND SCREEN
ABO/RH(D): A POS
Antibody Screen: NEGATIVE

## 2013-10-20 MED ORDER — ALBUTEROL SULFATE (2.5 MG/3ML) 0.083% IN NEBU
2.5000 mg | INHALATION_SOLUTION | Freq: Once | RESPIRATORY_TRACT | Status: AC
  Administered 2013-10-20: 2.5 mg via RESPIRATORY_TRACT

## 2013-10-20 NOTE — Pre-Procedure Instructions (Addendum)
Benjamin BambergKurt R Spears  10/20/2013   Your procedure is scheduled on:  Thursday, Octrober 15.  [facility] 5:30 AM.  Call this number if you have problems the morning of surgery: [phone]   Remember:   Do not eat food or drink liquids after midnight Wednesday, October 14.   Take these medicines the morning of surgery with A SIP OF WATER:  guaifenesin (MUCUS RELIEF).             Take if needed: ALPRAZolam Prudy Feeler(XANAX).               Stop taking NSAIDs: Aspirin , Aspirin Products, Aleve and Advil.   Do not wear jewelry, make-up or nail polish.  Do not wear lotions, powders, or perfumes.    Men may shave face and neck.  Do not bring valuables to the hospital.              [facility] any belongings or valuables.               Contacts, dentures or bridgework may not be worn into surgery.  Leave suitcase in the car. After surgery it may be brought to your room.  For patients admitted to the hospital, discharge time is determined by your treatment team.               Patients discharged the day of surgery will not be allowed to drive home.  Name and phone number of your driver: -   Special Instructions:[facility] - Preparing for Surgery  Before surgery, you can play an important role.  Because skin is not sterile, your skin needs to be as free of germs as possible.  You can reduce the number of germs on you skin by washing with CHG (chlorahexidine gluconate) soap before surgery.  CHG is an antiseptic cleaner which kills germs and bonds with the skin to continue killing germs even after washing.  Please DO NOT use if you have an allergy to CHG or antibacterial soaps.  If your skin becomes reddened/irritated stop using the CHG and inform your nurse when you arrive at Short Stay.  Do not shave (including legs and underarms) for at least 48 hours prior to the first CHG shower.  You may shave your face.  Please follow these instructions  carefully:   1.  Shower with CHG Soap the night before surgery and the                                morning of Surgery.  2.  If you choose to wash your hair, wash your hair first as usual with your       normal shampoo.  3.  After you shampoo, rinse your hair and body thoroughly to remove the                      Shampoo.  4.  Use CHG as you would any other liquid soap.  You can apply chg directly       to the skin and wash gently with scrungie or a clean washcloth.  5.  Apply the CHG Soap to your body ONLY FROM THE NECK DOWN.        Do not use on open wounds or open sores.  Avoid contact with your eyes,       ears, mouth and  genitals (private parts).  Wash genitals (private parts)       with your normal soap.  6.  Wash thoroughly, paying special attention to the area where your surgery        will be performed.  7.  Thoroughly rinse your body with warm water from the neck down.  8.  DO NOT shower/wash with your normal soap after using and rinsing off       the CHG Soap.  9.  Pat yourself dry with a clean towel.            10.  Wear clean pajamas.            11.  Place clean sheets on your bed the night of your first shower and do not        sleep with pets.  Day of Surgery  Do not apply any lotions/deoderants the morning of surgery.  Please wear clean clothes to the hospital/surgery center.      Please read over the following fact sheets that you were given: Pain Booklet, Coughing and Deep Breathing, Blood Transfusion Information and Surgical Site Infection Prevention And Incentive Spirometry.

## 2013-10-20 NOTE — Progress Notes (Signed)
Pre-op Cardiac Surgery  Carotid Findings:  Bilateral:  1-39% ICA stenosis.  Vertebral artery flow is antegrade.      Upper Extremity Right Left  Brachial Pressures 174 165  Radial Waveforms Tri Tri  Ulnar Waveforms Tri Tri  Palmar Arch (Allen's Test) Obliterates with radial compression, normal with ulnar compression Normal    Farrel DemarkJill Eunice, RDMS, RVT 10/20/2013

## 2013-10-22 ENCOUNTER — Encounter (HOSPITAL_COMMUNITY): Payer: Self-pay | Admitting: Certified Registered Nurse Anesthetist

## 2013-10-22 MED ORDER — DEXTROSE 5 % IV SOLN
30.0000 ug/min | INTRAVENOUS | Status: DC
  Filled 2013-10-22 (×2): qty 2

## 2013-10-22 MED ORDER — DEXTROSE 5 % IV SOLN
1.5000 g | INTRAVENOUS | Status: AC
  Administered 2013-10-23: .75 g via INTRAVENOUS
  Administered 2013-10-23: 1.5 g via INTRAVENOUS
  Filled 2013-10-22 (×2): qty 1.5

## 2013-10-22 MED ORDER — VANCOMYCIN HCL 10 G IV SOLR
1500.0000 mg | INTRAVENOUS | Status: AC
  Administered 2013-10-23: 1500 mg via INTRAVENOUS
  Filled 2013-10-22: qty 1500

## 2013-10-22 MED ORDER — PLASMA-LYTE 148 IV SOLN
INTRAVENOUS | Status: AC
  Administered 2013-10-23: 10:00:00
  Filled 2013-10-22 (×2): qty 2.5

## 2013-10-22 MED ORDER — SODIUM CHLORIDE 0.9 % IV SOLN
INTRAVENOUS | Status: DC
  Filled 2013-10-22 (×2): qty 40

## 2013-10-22 MED ORDER — DEXMEDETOMIDINE HCL IN NACL 400 MCG/100ML IV SOLN
0.1000 ug/kg/h | INTRAVENOUS | Status: DC
  Filled 2013-10-22: qty 100

## 2013-10-22 MED ORDER — DEXTROSE 5 % IV SOLN
750.0000 mg | INTRAVENOUS | Status: DC
  Filled 2013-10-22 (×2): qty 750

## 2013-10-22 MED ORDER — MAGNESIUM SULFATE 50 % IJ SOLN
40.0000 meq | INTRAMUSCULAR | Status: DC
  Filled 2013-10-22: qty 10

## 2013-10-22 MED ORDER — METOPROLOL TARTRATE 12.5 MG HALF TABLET: 12.5000 mg | ORAL_TABLET | Freq: Once | ORAL | Status: DC

## 2013-10-22 MED ORDER — SODIUM CHLORIDE 0.9 % IV SOLN
INTRAVENOUS | Status: DC
  Filled 2013-10-22 (×2): qty 30

## 2013-10-22 MED ORDER — DOPAMINE-DEXTROSE 3.2-5 MG/ML-% IV SOLN
0.0000 ug/kg/min | INTRAVENOUS | Status: DC
  Filled 2013-10-22: qty 250

## 2013-10-22 MED ORDER — POTASSIUM CHLORIDE 2 MEQ/ML IV SOLN
80.0000 meq | INTRAVENOUS | Status: DC
  Filled 2013-10-22: qty 40

## 2013-10-22 MED ORDER — DEXTROSE 5 % IV SOLN
0.5000 ug/min | INTRAVENOUS | Status: DC
  Filled 2013-10-22: qty 4

## 2013-10-22 MED ORDER — NITROGLYCERIN IN D5W 200-5 MCG/ML-% IV SOLN
2.0000 ug/min | INTRAVENOUS | Status: DC
  Filled 2013-10-22: qty 250

## 2013-10-22 MED ORDER — SODIUM CHLORIDE 0.9 % IV SOLN
INTRAVENOUS | Status: DC
  Filled 2013-10-22 (×2): qty 2.5

## 2013-10-23 ENCOUNTER — Inpatient Hospital Stay (HOSPITAL_COMMUNITY): Payer: 59

## 2013-10-23 ENCOUNTER — Encounter (HOSPITAL_COMMUNITY)
Admission: RE | Disposition: A | Discharge status: Home / Self Care | Payer: 59 | Source: Ambulatory Visit | Attending: Cardiothoracic Surgery

## 2013-10-23 ENCOUNTER — Inpatient Hospital Stay (HOSPITAL_COMMUNITY)
Admission: RE | Admit: 2013-10-23 | Discharge: 2013-10-29 | DRG: 220 | Disposition: A | Discharge status: Home / Self Care | Payer: 59 | Source: Ambulatory Visit | Attending: Cardiothoracic Surgery | Admitting: Cardiothoracic Surgery

## 2013-10-23 ENCOUNTER — Inpatient Hospital Stay (HOSPITAL_COMMUNITY): Payer: 59 | Admitting: Certified Registered Nurse Anesthetist

## 2013-10-23 ENCOUNTER — Encounter (HOSPITAL_COMMUNITY): Payer: Self-pay | Admitting: *Deleted

## 2013-10-23 ENCOUNTER — Encounter (HOSPITAL_COMMUNITY): Payer: 59 | Admitting: Certified Registered Nurse Anesthetist

## 2013-10-23 DIAGNOSIS — I350 Nonrheumatic aortic (valve) stenosis: Principal | ICD-10-CM | POA: Diagnosis present

## 2013-10-23 DIAGNOSIS — F17210 Nicotine dependence, cigarettes, uncomplicated: Secondary | ICD-10-CM | POA: Diagnosis present

## 2013-10-23 DIAGNOSIS — R011 Cardiac murmur, unspecified: Secondary | ICD-10-CM | POA: Diagnosis present

## 2013-10-23 DIAGNOSIS — J9811 Atelectasis: Secondary | ICD-10-CM | POA: Diagnosis not present

## 2013-10-23 DIAGNOSIS — D62 Acute posthemorrhagic anemia: Secondary | ICD-10-CM | POA: Diagnosis not present

## 2013-10-23 DIAGNOSIS — T501X5A Adverse effect of loop [high-ceiling] diuretics, initial encounter: Secondary | ICD-10-CM | POA: Diagnosis not present

## 2013-10-23 DIAGNOSIS — F419 Anxiety disorder, unspecified: Secondary | ICD-10-CM | POA: Diagnosis present

## 2013-10-23 DIAGNOSIS — E8770 Fluid overload, unspecified: Secondary | ICD-10-CM | POA: Diagnosis not present

## 2013-10-23 DIAGNOSIS — J449 Chronic obstructive pulmonary disease, unspecified: Secondary | ICD-10-CM | POA: Diagnosis present

## 2013-10-23 DIAGNOSIS — I480 Paroxysmal atrial fibrillation: Secondary | ICD-10-CM | POA: Diagnosis not present

## 2013-10-23 DIAGNOSIS — K5900 Constipation, unspecified: Secondary | ICD-10-CM | POA: Diagnosis not present

## 2013-10-23 DIAGNOSIS — E871 Hypo-osmolality and hyponatremia: Secondary | ICD-10-CM | POA: Diagnosis not present

## 2013-10-23 DIAGNOSIS — Z952 Presence of prosthetic heart valve: Secondary | ICD-10-CM

## 2013-10-23 DIAGNOSIS — I10 Essential (primary) hypertension: Secondary | ICD-10-CM | POA: Diagnosis present

## 2013-10-23 HISTORY — PX: INTRAOPERATIVE TRANSESOPHAGEAL ECHOCARDIOGRAM: SHX5062

## 2013-10-23 HISTORY — PX: AORTIC VALVE REPLACEMENT: SHX41

## 2013-10-23 LAB — POCT I-STAT, CHEM 8
BUN: 20 mg/dL (ref 6–23)
BUN: 20 mg/dL (ref 6–23)
BUN: 20 mg/dL (ref 6–23)
BUN: 20 mg/dL (ref 6–23)
BUN: 21 mg/dL (ref 6–23)
BUN: 19 mg/dL (ref 6–23)
BUN: 19 mg/dL (ref 6–23)
Calcium, Ion: 1.32 mmol/L — ABNORMAL HIGH (ref 1.13–1.30)
Calcium, Ion: 1.08 mmol/L — ABNORMAL LOW (ref 1.13–1.30)
Calcium, Ion: 1.25 mmol/L (ref 1.13–1.30)
Calcium, Ion: 1.15 mmol/L (ref 1.13–1.30)
Calcium, Ion: 1.15 mmol/L (ref 1.13–1.30)
Calcium, Ion: 1.18 mmol/L (ref 1.13–1.30)
Calcium, Ion: 1.27 mmol/L (ref 1.13–1.30)
Chloride: 103 mEq/L (ref 96–112)
Chloride: 103 mEq/L (ref 96–112)
Chloride: 105 mEq/L (ref 96–112)
Chloride: 103 mEq/L (ref 96–112)
Chloride: 103 mEq/L (ref 96–112)
Chloride: 103 mEq/L (ref 96–112)
Chloride: 106 mEq/L (ref 96–112)
Creatinine, Ser: 0.9 mg/dL (ref 0.50–1.35)
Creatinine, Ser: 0.8 mg/dL (ref 0.50–1.35)
Creatinine, Ser: 0.9 mg/dL (ref 0.50–1.35)
Creatinine, Ser: 1 mg/dL (ref 0.50–1.35)
Creatinine, Ser: 0.9 mg/dL (ref 0.50–1.35)
Creatinine, Ser: 1 mg/dL (ref 0.50–1.35)
Creatinine, Ser: 1 mg/dL (ref 0.50–1.35)
Glucose, Bld: 117 mg/dL — ABNORMAL HIGH (ref 70–99)
Glucose, Bld: 108 mg/dL — ABNORMAL HIGH (ref 70–99)
Glucose, Bld: 113 mg/dL — ABNORMAL HIGH (ref 70–99)
Glucose, Bld: 155 mg/dL — ABNORMAL HIGH (ref 70–99)
Glucose, Bld: 143 mg/dL — ABNORMAL HIGH (ref 70–99)
Glucose, Bld: 122 mg/dL — ABNORMAL HIGH (ref 70–99)
Glucose, Bld: 89 mg/dL (ref 70–99)
HCT: 44 % (ref 39.0–52.0)
HCT: 36 % — ABNORMAL LOW (ref 39.0–52.0)
HCT: 43 % (ref 39.0–52.0)
HCT: 37 % — ABNORMAL LOW (ref 39.0–52.0)
HCT: 37 % — ABNORMAL LOW (ref 39.0–52.0)
HCT: 35 % — ABNORMAL LOW (ref 39.0–52.0)
HCT: 37 % — ABNORMAL LOW (ref 39.0–52.0)
Hemoglobin: 15 g/dL (ref 13.0–17.0)
Hemoglobin: 12.2 g/dL — ABNORMAL LOW (ref 13.0–17.0)
Hemoglobin: 14.6 g/dL (ref 13.0–17.0)
Hemoglobin: 12.6 g/dL — ABNORMAL LOW (ref 13.0–17.0)
Hemoglobin: 12.6 g/dL — ABNORMAL LOW (ref 13.0–17.0)
Hemoglobin: 11.9 g/dL — ABNORMAL LOW (ref 13.0–17.0)
Hemoglobin: 12.6 g/dL — ABNORMAL LOW (ref 13.0–17.0)
Potassium: 4.3 mEq/L (ref 3.7–5.3)
Potassium: 4.4 mEq/L (ref 3.7–5.3)
Potassium: 4.5 mEq/L (ref 3.7–5.3)
Potassium: 5.6 mEq/L — ABNORMAL HIGH (ref 3.7–5.3)
Potassium: 6.1 mEq/L — ABNORMAL HIGH (ref 3.7–5.3)
Potassium: 4.3 mEq/L (ref 3.7–5.3)
Potassium: 4.6 mEq/L (ref 3.7–5.3)
Sodium: 137 mEq/L (ref 137–147)
Sodium: 136 mEq/L — ABNORMAL LOW (ref 137–147)
Sodium: 136 mEq/L — ABNORMAL LOW (ref 137–147)
Sodium: 131 mEq/L — ABNORMAL LOW (ref 137–147)
Sodium: 132 mEq/L — ABNORMAL LOW (ref 137–147)
Sodium: 136 mEq/L — ABNORMAL LOW (ref 137–147)
Sodium: 137 mEq/L (ref 137–147)
TCO2: 24 mmol/L (ref 0–100)
TCO2: 22 mmol/L (ref 0–100)
TCO2: 25 mmol/L (ref 0–100)
TCO2: 23 mmol/L (ref 0–100)
TCO2: 23 mmol/L (ref 0–100)
TCO2: 24 mmol/L (ref 0–100)
TCO2: 23 mmol/L (ref 0–100)

## 2013-10-23 LAB — POCT I-STAT 3, ART BLOOD GAS (G3+)
Acid-Base Excess: 2 mmol/L (ref 0.0–2.0)
Acid-base deficit: 3 mmol/L — ABNORMAL HIGH (ref 0.0–2.0)
Acid-base deficit: 3 mmol/L — ABNORMAL HIGH (ref 0.0–2.0)
Acid-base deficit: 1 mmol/L (ref 0.0–2.0)
Acid-base deficit: 2 mmol/L (ref 0.0–2.0)
Bicarbonate: 28.3 mEq/L — ABNORMAL HIGH (ref 20.0–24.0)
Bicarbonate: 23.3 mEq/L (ref 20.0–24.0)
Bicarbonate: 23.9 mEq/L (ref 20.0–24.0)
Bicarbonate: 24.7 mEq/L — ABNORMAL HIGH (ref 20.0–24.0)
Bicarbonate: 24 mEq/L (ref 20.0–24.0)
O2 Saturation: 100 %
O2 Saturation: 98 %
O2 Saturation: 97 %
O2 Saturation: 96 %
O2 Saturation: 96 %
Patient temperature: 35.9
Patient temperature: 37
Patient temperature: 37.4
TCO2: 30 mmol/L (ref 0–100)
TCO2: 25 mmol/L (ref 0–100)
TCO2: 25 mmol/L (ref 0–100)
TCO2: 26 mmol/L (ref 0–100)
TCO2: 25 mmol/L (ref 0–100)
pCO2 arterial: 47.4 mmHg — ABNORMAL HIGH (ref 35.0–45.0)
pCO2 arterial: 47 mmHg — ABNORMAL HIGH (ref 35.0–45.0)
pCO2 arterial: 44.7 mmHg (ref 35.0–45.0)
pCO2 arterial: 43 mmHg (ref 35.0–45.0)
pCO2 arterial: 44.9 mmHg (ref 35.0–45.0)
pH, Arterial: 7.383 (ref 7.350–7.450)
pH, Arterial: 7.303 — ABNORMAL LOW (ref 7.350–7.450)
pH, Arterial: 7.33 — ABNORMAL LOW (ref 7.350–7.450)
pH, Arterial: 7.368 (ref 7.350–7.450)
pH, Arterial: 7.337 — ABNORMAL LOW (ref 7.350–7.450)
pO2, Arterial: 411 mmHg — ABNORMAL HIGH (ref 80.0–100.0)
pO2, Arterial: 118 mmHg — ABNORMAL HIGH (ref 80.0–100.0)
pO2, Arterial: 96 mmHg (ref 80.0–100.0)
pO2, Arterial: 84 mmHg (ref 80.0–100.0)
pO2, Arterial: 87 mmHg (ref 80.0–100.0)

## 2013-10-23 LAB — HEMOGLOBIN AND HEMATOCRIT, BLOOD
HCT: 36.5 % — ABNORMAL LOW (ref 39.0–52.0)
Hemoglobin: 12.8 g/dL — ABNORMAL LOW (ref 13.0–17.0)

## 2013-10-23 LAB — CBC
HCT: 37 % — ABNORMAL LOW (ref 39.0–52.0)
HCT: 36.9 % — ABNORMAL LOW (ref 39.0–52.0)
Hemoglobin: 12.7 g/dL — ABNORMAL LOW (ref 13.0–17.0)
Hemoglobin: 12.7 g/dL — ABNORMAL LOW (ref 13.0–17.0)
MCH: 32 pg (ref 26.0–34.0)
MCH: 32.2 pg (ref 26.0–34.0)
MCHC: 34.3 g/dL (ref 30.0–36.0)
MCHC: 34.4 g/dL (ref 30.0–36.0)
MCV: 93.2 fL (ref 78.0–100.0)
MCV: 93.7 fL (ref 78.0–100.0)
Platelets: 145 10*3/uL — ABNORMAL LOW (ref 150–400)
Platelets: 153 10*3/uL (ref 150–400)
RBC: 3.97 MIL/uL — ABNORMAL LOW (ref 4.22–5.81)
RBC: 3.94 MIL/uL — ABNORMAL LOW (ref 4.22–5.81)
RDW: 12.4 % (ref 11.5–15.5)
RDW: 12.4 % (ref 11.5–15.5)
WBC: 13.1 10*3/uL — ABNORMAL HIGH (ref 4.0–10.5)
WBC: 10 10*3/uL (ref 4.0–10.5)

## 2013-10-23 LAB — MAGNESIUM: Magnesium: 2.8 mg/dL — ABNORMAL HIGH (ref 1.5–2.5)

## 2013-10-23 LAB — PROTIME-INR
INR: 1.41 (ref 0.00–1.49)
PROTHROMBIN TIME: 17.4 s — AB (ref 11.6–15.2)

## 2013-10-23 LAB — POCT I-STAT 4, (NA,K, GLUC, HGB,HCT)
Glucose, Bld: 83 mg/dL (ref 70–99)
HCT: 35 % — ABNORMAL LOW (ref 39.0–52.0)
Hemoglobin: 11.9 g/dL — ABNORMAL LOW (ref 13.0–17.0)
Potassium: 3.9 mEq/L (ref 3.7–5.3)
Sodium: 138 mEq/L (ref 137–147)

## 2013-10-23 LAB — CREATININE, SERUM
Creatinine, Ser: 1.05 mg/dL (ref 0.50–1.35)
GFR calc Af Amer: 87 mL/min — ABNORMAL LOW (ref >90)
GFR calc non Af Amer: 75 mL/min — ABNORMAL LOW (ref >90)

## 2013-10-23 LAB — APTT: APTT: 35 s (ref 24–37)

## 2013-10-23 LAB — PLATELET COUNT: Platelets: 176 K/uL (ref 150–400)

## 2013-10-23 SURGERY — REPLACEMENT, AORTIC VALVE, OPEN
Anesthesia: General | Site: Chest

## 2013-10-23 MED ORDER — HEMOSTATIC AGENTS (NO CHARGE) OPTIME
TOPICAL | Status: DC | PRN
  Administered 2013-10-23: 1 via TOPICAL

## 2013-10-23 MED ORDER — SODIUM CHLORIDE 0.45 % IV SOLN
INTRAVENOUS | Status: DC
  Administered 2013-10-23: 14:00:00 via INTRAVENOUS

## 2013-10-23 MED ORDER — PROTAMINE SULFATE 10 MG/ML IV SOLN
INTRAVENOUS | Status: AC
  Filled 2013-10-23: qty 10

## 2013-10-23 MED ORDER — BUDESONIDE-FORMOTEROL FUMARATE 160-4.5 MCG/ACT IN AERO
2.0000 | INHALATION_SPRAY | Freq: Two times a day (BID) | RESPIRATORY_TRACT | Status: DC
  Filled 2013-10-23: qty 6

## 2013-10-23 MED ORDER — SODIUM CHLORIDE 0.9 % IV SOLN
10.0000 g | INTRAVENOUS | Status: DC | PRN
  Administered 2013-10-23: 5 g/h via INTRAVENOUS

## 2013-10-23 MED ORDER — LEVALBUTEROL HCL 1.25 MG/0.5ML IN NEBU
1.2500 mg | INHALATION_SOLUTION | Freq: Four times a day (QID) | RESPIRATORY_TRACT | Status: DC
  Administered 2013-10-24 (×4): 1.25 mg via RESPIRATORY_TRACT
  Filled 2013-10-23 (×11): qty 0.5

## 2013-10-23 MED ORDER — SODIUM CHLORIDE 0.9 % IV SOLN: 250.0000 mL | INTRAVENOUS | Status: DC

## 2013-10-23 MED ORDER — CHLORHEXIDINE GLUCONATE 0.12 % MT SOLN
15.0000 mL | Freq: Two times a day (BID) | OROMUCOSAL | Status: DC
  Administered 2013-10-23: 15 mL via OROMUCOSAL
  Filled 2013-10-23: qty 15

## 2013-10-23 MED ORDER — SODIUM CHLORIDE 0.9 % IV SOLN: Freq: Once | INTRAVENOUS | Status: DC

## 2013-10-23 MED ORDER — FENTANYL CITRATE 0.05 MG/ML IJ SOLN
INTRAMUSCULAR | Status: AC
  Filled 2013-10-23: qty 5

## 2013-10-23 MED ORDER — PROTAMINE SULFATE 10 MG/ML IV SOLN
INTRAVENOUS | Status: DC | PRN
  Administered 2013-10-23: 340 mg via INTRAVENOUS
  Administered 2013-10-23: 10 mg via INTRAVENOUS

## 2013-10-23 MED ORDER — METOPROLOL TARTRATE 12.5 MG HALF TABLET
12.5000 mg | ORAL_TABLET | Freq: Two times a day (BID) | ORAL | Status: DC
  Filled 2013-10-23 (×5): qty 1

## 2013-10-23 MED ORDER — ARTIFICIAL TEARS OP OINT
TOPICAL_OINTMENT | OPHTHALMIC | Status: DC | PRN
  Administered 2013-10-23: 1 via OPHTHALMIC

## 2013-10-23 MED ORDER — MAGNESIUM SULFATE 4000MG/100ML IJ SOLN
4.0000 g | Freq: Once | INTRAMUSCULAR | Status: AC
  Administered 2013-10-23: 4 g via INTRAVENOUS
  Filled 2013-10-23: qty 100

## 2013-10-23 MED ORDER — VANCOMYCIN HCL IN DEXTROSE 1-5 GM/200ML-% IV SOLN: 1000.0000 mg | Freq: Once | INTRAVENOUS | Status: DC

## 2013-10-23 MED ORDER — BISACODYL 10 MG RE SUPP
10.0000 mg | Freq: Every day | RECTAL | Status: DC
  Administered 2013-10-26: 10 mg via RECTAL
  Filled 2013-10-23: qty 1

## 2013-10-23 MED ORDER — LACTATED RINGERS IV SOLN
INTRAVENOUS | Status: DC | PRN
  Administered 2013-10-23 (×2): via INTRAVENOUS

## 2013-10-23 MED ORDER — BISACODYL 5 MG PO TBEC
10.0000 mg | DELAYED_RELEASE_TABLET | Freq: Every day | ORAL | Status: DC
  Administered 2013-10-24 – 2013-10-27 (×4): 10 mg via ORAL
  Filled 2013-10-23 (×7): qty 2

## 2013-10-23 MED ORDER — SODIUM CHLORIDE 0.9 % IJ SOLN
3.0000 mL | Freq: Two times a day (BID) | INTRAMUSCULAR | Status: DC
  Administered 2013-10-24: 3 mL via INTRAVENOUS

## 2013-10-23 MED ORDER — MIDAZOLAM HCL 10 MG/2ML IJ SOLN
INTRAMUSCULAR | Status: AC
  Filled 2013-10-23: qty 2

## 2013-10-23 MED ORDER — POTASSIUM CHLORIDE 10 MEQ/50ML IV SOLN
10.0000 meq | INTRAVENOUS | Status: AC
  Administered 2013-10-23 (×3): 10 meq via INTRAVENOUS

## 2013-10-23 MED ORDER — CETYLPYRIDINIUM CHLORIDE 0.05 % MT LIQD
7.0000 mL | Freq: Four times a day (QID) | OROMUCOSAL | Status: DC
  Administered 2013-10-24 (×3): 7 mL via OROMUCOSAL

## 2013-10-23 MED ORDER — VANCOMYCIN HCL 10 G IV SOLR
1250.0000 mg | Freq: Two times a day (BID) | INTRAVENOUS | Status: AC
  Administered 2013-10-23 – 2013-10-24 (×3): 1250 mg via INTRAVENOUS
  Filled 2013-10-23 (×3): qty 1250

## 2013-10-23 MED ORDER — SODIUM CHLORIDE 0.9 % IJ SOLN
OROMUCOSAL | Status: DC | PRN
  Administered 2013-10-23: 10:00:00 via TOPICAL

## 2013-10-23 MED ORDER — LIDOCAINE IN D5W 4-5 MG/ML-% IV SOLN
1.0000 mg/min | INTRAVENOUS | Status: DC
  Administered 2013-10-23: 1 mg/min via INTRAVENOUS
  Filled 2013-10-23: qty 500

## 2013-10-23 MED ORDER — LACTATED RINGERS IV SOLN
INTRAVENOUS | Status: DC | PRN
  Administered 2013-10-23: 07:00:00 via INTRAVENOUS

## 2013-10-23 MED ORDER — SODIUM CHLORIDE 0.9 % IV SOLN
250.0000 [IU] | INTRAVENOUS | Status: DC | PRN
  Administered 2013-10-23: 1.7 [IU]/h via INTRAVENOUS

## 2013-10-23 MED ORDER — SODIUM CHLORIDE 0.9 % IV SOLN: INTRAVENOUS | Status: DC

## 2013-10-23 MED ORDER — ALBUMIN HUMAN 5 % IV SOLN
INTRAVENOUS | Status: DC | PRN
  Administered 2013-10-23 (×3): via INTRAVENOUS

## 2013-10-23 MED ORDER — DOPAMINE-DEXTROSE 3.2-5 MG/ML-% IV SOLN
INTRAVENOUS | Status: DC | PRN
  Administered 2013-10-23: 2 ug/kg/min via INTRAVENOUS

## 2013-10-23 MED ORDER — TRAMADOL HCL 50 MG PO TABS
50.0000 mg | ORAL_TABLET | ORAL | Status: DC | PRN
  Administered 2013-10-24 – 2013-10-26 (×6): 100 mg via ORAL
  Filled 2013-10-23 (×6): qty 2

## 2013-10-23 MED ORDER — OXYCODONE HCL 5 MG PO TABS
5.0000 mg | ORAL_TABLET | ORAL | Status: DC | PRN
  Administered 2013-10-23 – 2013-10-29 (×12): 10 mg via ORAL
  Filled 2013-10-23 (×14): qty 2

## 2013-10-23 MED ORDER — ROCURONIUM BROMIDE 100 MG/10ML IV SOLN
INTRAVENOUS | Status: DC | PRN
  Administered 2013-10-23: 100 mg via INTRAVENOUS

## 2013-10-23 MED ORDER — MORPHINE SULFATE 2 MG/ML IJ SOLN
1.0000 mg | INTRAMUSCULAR | Status: AC | PRN
  Administered 2013-10-23 (×4): 2 mg via INTRAVENOUS
  Filled 2013-10-23: qty 2

## 2013-10-23 MED ORDER — DOPAMINE-DEXTROSE 3.2-5 MG/ML-% IV SOLN: 0.0000 ug/kg/min | INTRAVENOUS | Status: DC

## 2013-10-23 MED ORDER — CHLORHEXIDINE GLUCONATE 4 % EX LIQD
30.0000 mL | CUTANEOUS | Status: DC
  Filled 2013-10-23: qty 30

## 2013-10-23 MED ORDER — PROPOFOL 10 MG/ML IV BOLUS
INTRAVENOUS | Status: AC
  Filled 2013-10-23: qty 20

## 2013-10-23 MED ORDER — SODIUM CHLORIDE 0.9 % IJ SOLN: 3.0000 mL | INTRAMUSCULAR | Status: DC | PRN

## 2013-10-23 MED ORDER — METOPROLOL TARTRATE 1 MG/ML IV SOLN: 2.5000 mg | INTRAVENOUS | Status: DC | PRN

## 2013-10-23 MED ORDER — MIDAZOLAM HCL 5 MG/5ML IJ SOLN
INTRAMUSCULAR | Status: DC | PRN
Start: 2013-10-23 — End: 2013-10-23
  Administered 2013-10-23: 2 mg via INTRAVENOUS
  Administered 2013-10-23: 5 mg via INTRAVENOUS
  Administered 2013-10-23: 3 mg via INTRAVENOUS

## 2013-10-23 MED ORDER — BUDESONIDE-FORMOTEROL FUMARATE 160-4.5 MCG/ACT IN AERO
2.0000 | INHALATION_SPRAY | Freq: Two times a day (BID) | RESPIRATORY_TRACT | Status: DC
  Administered 2013-10-23 – 2013-10-29 (×10): 2 via RESPIRATORY_TRACT
  Filled 2013-10-23 (×2): qty 6

## 2013-10-23 MED ORDER — DEXTROSE 5 % IV SOLN
1.5000 g | Freq: Two times a day (BID) | INTRAVENOUS | Status: AC
  Administered 2013-10-23 – 2013-10-25 (×4): 1.5 g via INTRAVENOUS
  Filled 2013-10-23 (×4): qty 1.5

## 2013-10-23 MED ORDER — METOPROLOL TARTRATE 25 MG/10 ML ORAL SUSPENSION
12.5000 mg | Freq: Two times a day (BID) | ORAL | Status: DC
  Filled 2013-10-23 (×5): qty 5

## 2013-10-23 MED ORDER — PHENYLEPHRINE HCL 10 MG/ML IJ SOLN
10.0000 mg | INTRAVENOUS | Status: DC | PRN
  Administered 2013-10-23: 25 ug/min via INTRAVENOUS

## 2013-10-23 MED ORDER — ACETAMINOPHEN 160 MG/5ML PO SOLN: 650.0000 mg | Freq: Once | ORAL | Status: AC

## 2013-10-23 MED ORDER — LACTATED RINGERS IV SOLN
INTRAVENOUS | Status: DC | PRN
  Administered 2013-10-23 (×2): via INTRAVENOUS

## 2013-10-23 MED ORDER — DOCUSATE SODIUM 100 MG PO CAPS
200.0000 mg | ORAL_CAPSULE | Freq: Every day | ORAL | Status: DC
  Administered 2013-10-24 – 2013-10-28 (×5): 200 mg via ORAL
  Filled 2013-10-23 (×6): qty 2

## 2013-10-23 MED ORDER — NITROGLYCERIN IN D5W 200-5 MCG/ML-% IV SOLN: 0.0000 ug/min | INTRAVENOUS | Status: DC

## 2013-10-23 MED ORDER — MIDAZOLAM HCL 2 MG/2ML IJ SOLN: 2.0000 mg | INTRAMUSCULAR | Status: DC | PRN

## 2013-10-23 MED ORDER — ASPIRIN EC 325 MG PO TBEC
325.0000 mg | DELAYED_RELEASE_TABLET | Freq: Every day | ORAL | Status: DC
Start: 2013-10-24 — End: 2013-10-29
  Administered 2013-10-24 – 2013-10-29 (×6): 325 mg via ORAL
  Filled 2013-10-23 (×6): qty 1

## 2013-10-23 MED ORDER — FAMOTIDINE IN NACL 20-0.9 MG/50ML-% IV SOLN
20.0000 mg | Freq: Two times a day (BID) | INTRAVENOUS | Status: AC
  Administered 2013-10-23: 20 mg via INTRAVENOUS

## 2013-10-23 MED ORDER — SODIUM CHLORIDE 0.9 % IV SOLN
INTRAVENOUS | Status: DC
  Administered 2013-10-23: 21:00:00 via INTRAVENOUS
  Filled 2013-10-23 (×2): qty 2.5

## 2013-10-23 MED ORDER — PANTOPRAZOLE SODIUM 40 MG PO TBEC
40.0000 mg | DELAYED_RELEASE_TABLET | Freq: Every day | ORAL | Status: DC
  Administered 2013-10-25 – 2013-10-29 (×5): 40 mg via ORAL
  Filled 2013-10-23 (×5): qty 1

## 2013-10-23 MED ORDER — KETOROLAC TROMETHAMINE 15 MG/ML IJ SOLN
15.0000 mg | Freq: Four times a day (QID) | INTRAMUSCULAR | Status: AC
  Administered 2013-10-23 – 2013-10-24 (×4): 15 mg via INTRAVENOUS
  Filled 2013-10-23 (×6): qty 1

## 2013-10-23 MED ORDER — 0.9 % SODIUM CHLORIDE (POUR BTL) OPTIME
TOPICAL | Status: DC | PRN
  Administered 2013-10-23: 1000 mL

## 2013-10-23 MED ORDER — ALBUMIN HUMAN 5 % IV SOLN: 250.0000 mL | INTRAVENOUS | Status: AC | PRN

## 2013-10-23 MED ORDER — ACETAMINOPHEN 500 MG PO TABS
1000.0000 mg | ORAL_TABLET | Freq: Four times a day (QID) | ORAL | Status: AC
  Administered 2013-10-24 – 2013-10-28 (×13): 1000 mg via ORAL
  Filled 2013-10-23 (×19): qty 2

## 2013-10-23 MED ORDER — PHENYLEPHRINE HCL 10 MG/ML IJ SOLN
0.0000 ug/min | INTRAVENOUS | Status: DC
  Filled 2013-10-23 (×2): qty 2

## 2013-10-23 MED ORDER — ACETAMINOPHEN 160 MG/5ML PO SOLN
1000.0000 mg | Freq: Four times a day (QID) | ORAL | Status: AC
  Filled 2013-10-23: qty 40

## 2013-10-23 MED ORDER — FENTANYL CITRATE 0.05 MG/ML IJ SOLN
INTRAMUSCULAR | Status: DC | PRN
  Administered 2013-10-23: 250 ug via INTRAVENOUS
  Administered 2013-10-23: 50 ug via INTRAVENOUS
  Administered 2013-10-23: 150 ug via INTRAVENOUS
  Administered 2013-10-23: 50 ug via INTRAVENOUS
  Administered 2013-10-23: 100 ug via INTRAVENOUS
  Administered 2013-10-23: 50 ug via INTRAVENOUS
  Administered 2013-10-23: 250 ug via INTRAVENOUS
  Administered 2013-10-23: 50 ug via INTRAVENOUS
  Administered 2013-10-23: 312.5 ug via INTRAVENOUS
  Administered 2013-10-23 (×5): 50 ug via INTRAVENOUS
  Administered 2013-10-23: 25 ug via INTRAVENOUS
  Administered 2013-10-23: 12.5 ug via INTRAVENOUS
  Administered 2013-10-23 (×9): 50 ug via INTRAVENOUS

## 2013-10-23 MED ORDER — LACTATED RINGERS IV SOLN: 500.0000 mL | Freq: Once | INTRAVENOUS | Status: AC | PRN

## 2013-10-23 MED ORDER — DEXMEDETOMIDINE HCL IN NACL 200 MCG/50ML IV SOLN
0.0000 ug/kg/h | INTRAVENOUS | Status: DC
  Administered 2013-10-23: 0.3 ug/kg/h via INTRAVENOUS
  Filled 2013-10-23: qty 50

## 2013-10-23 MED ORDER — INSULIN REGULAR BOLUS VIA INFUSION
0.0000 [IU] | Freq: Three times a day (TID) | INTRAVENOUS | Status: DC
  Filled 2013-10-23: qty 10

## 2013-10-23 MED ORDER — ONDANSETRON HCL 4 MG/2ML IJ SOLN
4.0000 mg | Freq: Four times a day (QID) | INTRAMUSCULAR | Status: DC | PRN
  Administered 2013-10-24 – 2013-10-25 (×3): 4 mg via INTRAVENOUS
  Filled 2013-10-23 (×3): qty 2

## 2013-10-23 MED ORDER — LACTATED RINGERS IV SOLN: INTRAVENOUS | Status: DC

## 2013-10-23 MED ORDER — MORPHINE SULFATE 2 MG/ML IJ SOLN
2.0000 mg | INTRAMUSCULAR | Status: DC | PRN
  Administered 2013-10-23 (×2): 2 mg via INTRAVENOUS
  Administered 2013-10-24: 4 mg via INTRAVENOUS
  Administered 2013-10-24: 2 mg via INTRAVENOUS
  Administered 2013-10-24: 4 mg via INTRAVENOUS
  Filled 2013-10-23 (×2): qty 2
  Filled 2013-10-23 (×2): qty 1
  Filled 2013-10-23: qty 2
  Filled 2013-10-23: qty 1

## 2013-10-23 MED ORDER — VECURONIUM BROMIDE 10 MG IV SOLR
INTRAVENOUS | Status: DC | PRN
  Administered 2013-10-23 (×5): 5 mg via INTRAVENOUS

## 2013-10-23 MED ORDER — ASPIRIN 81 MG PO CHEW
324.0000 mg | CHEWABLE_TABLET | Freq: Every day | ORAL | Status: DC
  Filled 2013-10-23: qty 4

## 2013-10-23 MED ORDER — LEVALBUTEROL HCL 0.63 MG/3ML IN NEBU
INHALATION_SOLUTION | RESPIRATORY_TRACT | Status: AC
  Administered 2013-10-23: 0.63 mg
  Filled 2013-10-23: qty 3

## 2013-10-23 MED ORDER — PROTAMINE SULFATE 10 MG/ML IV SOLN
INTRAVENOUS | Status: AC
  Filled 2013-10-23: qty 25

## 2013-10-23 MED ORDER — HEPARIN SODIUM (PORCINE) 1000 UNIT/ML IJ SOLN
INTRAMUSCULAR | Status: DC | PRN
  Administered 2013-10-23: 40 mL via INTRAVENOUS

## 2013-10-23 MED ORDER — PROPOFOL 10 MG/ML IV BOLUS
INTRAVENOUS | Status: DC | PRN
  Administered 2013-10-23: 160 mg via INTRAVENOUS

## 2013-10-23 MED ORDER — ACETAMINOPHEN 650 MG RE SUPP
650.0000 mg | Freq: Once | RECTAL | Status: AC
  Administered 2013-10-23: 650 mg via RECTAL

## 2013-10-23 MED ORDER — SODIUM CHLORIDE 0.9 % IV SOLN
200.0000 ug | INTRAVENOUS | Status: DC | PRN
  Administered 2013-10-23: 13:00:00 via INTRAVENOUS
  Administered 2013-10-23: 0.3 ug/kg/h via INTRAVENOUS

## 2013-10-23 MED FILL — Heparin Sodium (Porcine) Inj 1000 Unit/ML: INTRAMUSCULAR | Qty: 10 | Status: AC

## 2013-10-23 MED FILL — Sodium Bicarbonate IV Soln 8.4%: INTRAVENOUS | Qty: 50 | Status: AC

## 2013-10-23 MED FILL — Electrolyte-R (PH 7.4) Solution: INTRAVENOUS | Qty: 4000 | Status: AC

## 2013-10-23 MED FILL — Mannitol IV Soln 20%: INTRAVENOUS | Qty: 500 | Status: AC

## 2013-10-23 MED FILL — Sodium Chloride IV Soln 0.9%: INTRAVENOUS | Qty: 3000 | Status: AC

## 2013-10-23 MED FILL — Lidocaine HCl IV Inj 20 MG/ML: INTRAVENOUS | Qty: 10 | Status: AC

## 2013-10-23 SURGICAL SUPPLY — 86 items
ADAPTER CARDIO PERF ANTE/RETRO (ADAPTER) ×4 IMPLANT
ADH SRG 12 PREFL SYR 3 SPRDR (MISCELLANEOUS)
ADPR PRFSN 84XANTGRD RTRGD (ADAPTER) ×2
AGENT HMST MTR 8 SURGIFLO (HEMOSTASIS) ×2
ATTRACTOMAT 16X20 MAGNETIC DRP (DRAPES) ×4 IMPLANT
BAG DECANTER FOR FLEXI CONT (MISCELLANEOUS) ×4 IMPLANT
BLADE STERNUM SYSTEM 6 (BLADE) ×4 IMPLANT
BLADE SURG 12 STRL SS (BLADE) ×4 IMPLANT
BLADE SURG 15 STRL LF DISP TIS (BLADE) ×2 IMPLANT
BLADE SURG 15 STRL SS (BLADE) ×4
CANISTER SUCTION 2500CC (MISCELLANEOUS) ×4 IMPLANT
CANNULA ARTERIAL NVNT 3/8 22FR (MISCELLANEOUS) ×2 IMPLANT
CANNULA GUNDRY RCSP 15FR (MISCELLANEOUS) ×4 IMPLANT
CATH CPB KIT VANTRIGT (MISCELLANEOUS) ×4 IMPLANT
CATH HEART VENT LEFT (CATHETERS) ×2 IMPLANT
CATH RETROPLEGIA CORONARY 14FR (CATHETERS) IMPLANT
CATH ROBINSON RED A/P 18FR (CATHETERS) ×14 IMPLANT
CATH THORACIC 36FR RT ANG (CATHETERS) ×4 IMPLANT
CLIP FOGARTY SPRING 6M (CLIP) IMPLANT
CLIP TI WIDE RED SMALL 24 (CLIP) ×4 IMPLANT
COVER SURGICAL LIGHT HANDLE (MISCELLANEOUS) ×8 IMPLANT
CRADLE DONUT ADULT HEAD (MISCELLANEOUS) ×4 IMPLANT
DRAIN CHANNEL 32F RND 10.7 FF (WOUND CARE) ×4 IMPLANT
DRAPE CARDIOVASCULAR INCISE (DRAPES) ×4
DRAPE SLUSH/WARMER DISC (DRAPES) ×4 IMPLANT
DRAPE SRG 135X102X78XABS (DRAPES) ×2 IMPLANT
DRSG AQUACEL AG ADV 3.5X14 (GAUZE/BANDAGES/DRESSINGS) ×4 IMPLANT
ELECT BLADE 4.0 EZ CLEAN MEGAD (MISCELLANEOUS) ×8
ELECT BLADE 6.5 EXT (BLADE) ×4 IMPLANT
ELECT CAUTERY BLADE 6.4 (BLADE) ×4 IMPLANT
ELECT REM PT RETURN 9FT ADLT (ELECTROSURGICAL) ×8
ELECTRODE BLDE 4.0 EZ CLN MEGD (MISCELLANEOUS) ×2 IMPLANT
ELECTRODE REM PT RTRN 9FT ADLT (ELECTROSURGICAL) ×4 IMPLANT
GAUZE SPONGE 4X4 12PLY STRL (GAUZE/BANDAGES/DRESSINGS) ×8 IMPLANT
GLOVE BIO SURGEON STRL SZ 6 (GLOVE) ×10 IMPLANT
GLOVE BIO SURGEON STRL SZ7.5 (GLOVE) ×16 IMPLANT
GLOVE BIOGEL PI IND STRL 6.5 (GLOVE) IMPLANT
GLOVE BIOGEL PI IND STRL 7.0 (GLOVE) IMPLANT
GLOVE BIOGEL PI INDICATOR 6.5 (GLOVE) ×8
GLOVE BIOGEL PI INDICATOR 7.0 (GLOVE) ×12
GOWN STRL REUS W/ TWL LRG LVL3 (GOWN DISPOSABLE) ×8 IMPLANT
GOWN STRL REUS W/TWL LRG LVL3 (GOWN DISPOSABLE) ×16
GUIDEWIRE SAFETY (WIRE) ×6 IMPLANT
HEMOSTAT POWDER SURGIFOAM 1G (HEMOSTASIS) ×12 IMPLANT
HEMOSTAT SURGICEL 2X14 (HEMOSTASIS) ×4 IMPLANT
INSERT FOGARTY XLG (MISCELLANEOUS) ×2 IMPLANT
KIT BASIN OR (CUSTOM PROCEDURE TRAY) ×4 IMPLANT
KIT ROOM TURNOVER OR (KITS) ×4 IMPLANT
KIT SUCTION CATH 14FR (SUCTIONS) ×4 IMPLANT
LEAD PACING MYOCARDI (MISCELLANEOUS) ×4 IMPLANT
LINE VENT (MISCELLANEOUS) ×2 IMPLANT
NS IRRIG 1000ML POUR BTL (IV SOLUTION) ×24 IMPLANT
PACK OPEN HEART (CUSTOM PROCEDURE TRAY) ×4 IMPLANT
PAD ARMBOARD 7.5X6 YLW CONV (MISCELLANEOUS) ×8 IMPLANT
SET CARDIOPLEGIA MPS 5001102 (MISCELLANEOUS) ×2 IMPLANT
SPOGE SURGIFLO 8M (HEMOSTASIS) ×2
SPONGE GAUZE 4X4 12PLY STER LF (GAUZE/BANDAGES/DRESSINGS) ×2 IMPLANT
SPONGE SURGIFLO 8M (HEMOSTASIS) IMPLANT
SURGIFLO W/THROMBIN 8M KIT (HEMOSTASIS) ×6 IMPLANT
SUT BONE WAX W31G (SUTURE) ×4 IMPLANT
SUT ETHIBON 2 0 V 52N 30 (SUTURE) ×8 IMPLANT
SUT ETHIBOND 2 0 SH (SUTURE) ×4
SUT ETHIBOND 2 0 SH 36X2 (SUTURE) ×2 IMPLANT
SUT PROLENE 3 0 RB 1 (SUTURE) ×4 IMPLANT
SUT PROLENE 3 0 SH 1 (SUTURE) IMPLANT
SUT PROLENE 3 0 SH DA (SUTURE) IMPLANT
SUT PROLENE 4 0 RB 1 (SUTURE) ×68
SUT PROLENE 4 0 SH DA (SUTURE) ×8 IMPLANT
SUT PROLENE 4-0 RB1 .5 CRCL 36 (SUTURE) ×6 IMPLANT
SUT PROLENE 6 0 C 1 30 (SUTURE) ×4 IMPLANT
SUT STEEL 6MS V (SUTURE) ×8 IMPLANT
SUT STEEL SZ 6 DBL 3X14 BALL (SUTURE) ×4 IMPLANT
SUT VIC AB 1 CTX 36 (SUTURE) ×8
SUT VIC AB 1 CTX36XBRD ANBCTR (SUTURE) ×4 IMPLANT
SUT VIC AB 2-0 CTX 27 (SUTURE) IMPLANT
SUT VIC AB 3-0 X1 27 (SUTURE) IMPLANT
SYR 10ML KIT SKIN ADHESIVE (MISCELLANEOUS) IMPLANT
SYSTEM SAHARA CHEST DRAIN ATS (WOUND CARE) ×4 IMPLANT
TAPE CLOTH SURG 4X10 WHT LF (GAUZE/BANDAGES/DRESSINGS) ×2 IMPLANT
TOWEL OR 17X24 6PK STRL BLUE (TOWEL DISPOSABLE) ×8 IMPLANT
TOWEL OR 17X26 10 PK STRL BLUE (TOWEL DISPOSABLE) ×8 IMPLANT
TRAY FOLEY IC TEMP SENS 16FR (CATHETERS) ×4 IMPLANT
UNDERPAD 30X30 INCONTINENT (UNDERPADS AND DIAPERS) ×4 IMPLANT
VALVE MAGNA EASE AORTIC 25MM (Prosthesis & Implant Heart) ×2 IMPLANT
VENT LEFT HEART 12002 (CATHETERS) ×4
WATER STERILE IRR 1000ML POUR (IV SOLUTION) ×8 IMPLANT

## 2013-10-23 NOTE — Brief Op Note (Signed)
10/23/2013  11:42 AM  PATIENT:  Benjamin Spears  63 y.o. male  PRE-OPERATIVE DIAGNOSIS:  Severe Aortic Stenosis  POST-OPERATIVE DIAGNOSIS:  Severe Aortic Stenosis  PROCEDURE:  INTRAOPERATIVE TRANSESOPHAGEAL ECHOCARDIOGRAM, AORTIC VALVE REPLACEMENT (AVR) (using a Magna Ease Pericardial Tissue Valve, size 25 mm)  SURGEON:  Surgeon(s) and Role:    * Kerin PernaPeter Van Trigt, MD - Primary  PHYSICIAN ASSISTANT: Doree Fudgeonielle Vi Whitesel PA-C  ANESTHESIA:   general  EBL:  Total I/O In: -  Out: 190 [Urine:190]  BLOOD ADMINISTERED:Two FFP and One PLTS  DRAINS: Chest tubes placed in the mediastinal and pleural spaces   SPECIMEN:  Source of Specimen:  Native aortic valve leaflets  DISPOSITION OF SPECIMEN:  PATHOLOGY  COUNTS CORRECT:  YES  DICTATION: .Dragon Dictation  PLAN OF CARE: Admit to inpatient   PATIENT DISPOSITION:  ICU - intubated and hemodynamically stable.   Delay start of Pharmacological VTE agent (>24hrs) due to surgical blood loss or risk of bleeding: yes  BASELINE WEIGHT: 112 kg Aortic Valve Etiology   Aortic Insufficiency:  Trivial/Trace  Aortic Valve Disease:  Yes.  Aortic Stenosis:  Yes. Smallest Aortic Valve Area: 0.76cm2; Highest Mean Gradient: 102mmHg.  Etiology (Choose at least one and up to  5 etiologies):  Bicuspid valve disease  Aortic Valve  Procedure Performed:  Replacement: Yes.  Bioprosthetic Valve. Implant Model Number:[id number], Size:25 mm, Unique Device Identifier:[id number]  Repair/Reconstruction: No  Aortic Annular Enlargement: No.

## 2013-10-23 NOTE — Anesthesia Procedure Notes (Addendum)
Procedure Name: Intubation Date/Time: 10/23/2013 7:48 AM Performed by: Dairl PonderJIANG, Leshawn Houseworth Pre-anesthesia Checklist: Patient identified, Timeout performed, Emergency Drugs available, Suction available and Patient being monitored Patient Re-evaluated:Patient Re-evaluated prior to inductionOxygen Delivery Method: Circle system utilized Preoxygenation: Pre-oxygenation with 100% oxygen Intubation Type: IV induction Ventilation: Mask ventilation without difficulty Laryngoscope Size: Mac and 4 Grade View: Grade II Tube type: Oral Tube size: 8.0 mm Number of attempts: 1 Airway Equipment and Method: Stylet Placement Confirmation: ETT inserted through vocal cords under direct vision,  breath sounds checked- equal and bilateral and positive ETCO2 Secured at: 22 cm Tube secured with: Tape Dental Injury: Teeth and Oropharynx as per pre-operative assessment       The patient was identified and consent obtained.  TO was performed, and full barrier precautions were used.  The skin was anesthetized with lidocaine.  Once the vein was located with the 22 ga. needle using ultrasound guidance , the wire was inserted into the vein.  The wire location was confirmed with ultrasound.  The insertion site was dilated and the introducer was carefully inserted and sutured in place. The PAC was checked, and floated into the PA.  Once in the PA, the catheter was secured. The patient tolerated the procedure well.  CXR was ordered for PACU. Start: [id number] End: [address] Jabsaniel Singer, MD

## 2013-10-23 NOTE — Anesthesia Postprocedure Evaluation (Signed)
  Anesthesia Post-op Note  Patient: Maebelle MunroeKurt R Dandy  Procedure(s) Performed: Procedure(s): AORTIC VALVE REPLACEMENT (AVR) (N/A) INTRAOPERATIVE TRANSESOPHAGEAL ECHOCARDIOGRAM (N/A)  Patient Location: SICU  Anesthesia Type:General  Level of Consciousness: Patient remains intubated per anesthesia plan  Airway and Oxygen Therapy: Patient remains intubated per anesthesia plan  Post-op Pain: none  Post-op Assessment: Post-op Vital signs reviewed and Patient's Cardiovascular Status Stable  Post-op Vital Signs: Reviewed and stable  Last Vitals:  Filed Vitals:   10/23/13 0607  BP: 164/99  Pulse: 51  Temp: 36.5 C  Resp: 18    Complications: No apparent anesthesia complications

## 2013-10-23 NOTE — Anesthesia Preprocedure Evaluation (Addendum)
Anesthesia Evaluation  Patient identified by MRN, date of birth, ID band Patient awake    Reviewed: Allergy & Precautions, H&P , NPO status , Patient's Chart, lab work & pertinent test results  Airway Mallampati: II TM Distance: >3 FB Neck ROM: Full    Dental  (+) Teeth Intact, Dental Advisory Given,    Pulmonary Current Smoker,    Pulmonary exam normal       Cardiovascular hypertension, Pt. on medications and Pt. on home beta blockers + Valvular Problems/Murmurs AS Rhythm:Regular + Systolic murmurs    Neuro/Psych PSYCHIATRIC DISORDERS Anxiety negative neurological ROS     GI/Hepatic negative GI ROS, Neg liver ROS,   Endo/Other  negative endocrine ROS  Renal/GU negative Renal ROS     Musculoskeletal   Abdominal   Peds  Hematology   Anesthesia Other Findings   Reproductive/Obstetrics                          Anesthesia Physical Anesthesia Plan  ASA: III  Anesthesia Plan: General   Post-op Pain Management:    Induction: Intravenous  Airway Management Planned: Oral ETT  Additional Equipment: Arterial line, 3D TEE and PA Cath  Intra-op Plan:   Post-operative Plan: Extubation in OR  Informed Consent: I have reviewed the patients History and Physical, chart, labs and discussed the procedure including the risks, benefits and alternatives for the proposed anesthesia with the patient or authorized representative who has indicated his/her understanding and acceptance.   Dental advisory given  Plan Discussed with: CRNA, Anesthesiologist and Surgeon  Anesthesia Plan Comments:         Anesthesia Quick Evaluation

## 2013-10-23 NOTE — Transfer of Care (Signed)
Immediate Anesthesia Transfer of Care Note  Patient: Benjamin Spears  Procedure(s) Performed: Procedure(s): AORTIC VALVE REPLACEMENT (AVR) (N/A) INTRAOPERATIVE TRANSESOPHAGEAL ECHOCARDIOGRAM (N/A)  Patient Location: SICU  Anesthesia Type:General  Level of Consciousness: Patient remains intubated per anesthesia plan  Airway & Oxygen Therapy: Patient remains intubated per anesthesia plan  Post-op Assessment: Report given to PACU RN  Post vital signs: Reviewed and stable  Complications: No apparent anesthesia complications

## 2013-10-23 NOTE — Progress Notes (Signed)
*  PRELIMINARY RESULTS* Echocardiogram Echocardiogram Transesophageal has been performed.  Benjamin Spears, Benjamin Spears 10/23/2013, 9:48 AM

## 2013-10-23 NOTE — Progress Notes (Signed)
The patient was examined and preop studies reviewed. There has been no change from the prior exam and the patient is ready for surgery.   Plan AVR with tissue valve on Benjamin Spears today

## 2013-10-23 NOTE — OR Nursing (Signed)
45 min call made to SICU nurse  

## 2013-10-23 NOTE — Procedures (Signed)
Extubation Procedure Note  Patient Details:   Name: Maebelle MunroeKurt R Gaut DOB: 05/09/1950 MRN: [id number]   Airway Documentation:     Evaluation  O2 sats: stable throughout Complications: No apparent complications Patient did tolerate procedure well. Bilateral Breath Sounds: Clear;Diminished Suctioning: Oral;Airway Yes  Pt tolerated rapid wean, VC 950mL, NIF -22, positive for cuff leak, extubated to 4L [place]. No stridor or dyspnea noted after extubation.   Armando GangMike, Kisa Fujii C 10/23/2013, 11:33 PM

## 2013-10-24 ENCOUNTER — Inpatient Hospital Stay (HOSPITAL_COMMUNITY): Payer: 59

## 2013-10-24 LAB — GLUCOSE, CAPILLARY
Glucose-Capillary: 101 mg/dL — ABNORMAL HIGH (ref 70–99)
Glucose-Capillary: 103 mg/dL — ABNORMAL HIGH (ref 70–99)
Glucose-Capillary: 103 mg/dL — ABNORMAL HIGH (ref 70–99)
Glucose-Capillary: 104 mg/dL — ABNORMAL HIGH (ref 70–99)
Glucose-Capillary: 104 mg/dL — ABNORMAL HIGH (ref 70–99)
Glucose-Capillary: 106 mg/dL — ABNORMAL HIGH (ref 70–99)
Glucose-Capillary: 108 mg/dL — ABNORMAL HIGH (ref 70–99)
Glucose-Capillary: 109 mg/dL — ABNORMAL HIGH (ref 70–99)
Glucose-Capillary: 109 mg/dL — ABNORMAL HIGH (ref 70–99)
Glucose-Capillary: 110 mg/dL — ABNORMAL HIGH (ref 70–99)
Glucose-Capillary: 110 mg/dL — ABNORMAL HIGH (ref 70–99)
Glucose-Capillary: 111 mg/dL — ABNORMAL HIGH (ref 70–99)
Glucose-Capillary: 111 mg/dL — ABNORMAL HIGH (ref 70–99)
Glucose-Capillary: 114 mg/dL — ABNORMAL HIGH (ref 70–99)
Glucose-Capillary: 114 mg/dL — ABNORMAL HIGH (ref 70–99)
Glucose-Capillary: 115 mg/dL — ABNORMAL HIGH (ref 70–99)
Glucose-Capillary: 115 mg/dL — ABNORMAL HIGH (ref 70–99)
Glucose-Capillary: 125 mg/dL — ABNORMAL HIGH (ref 70–99)
Glucose-Capillary: 92 mg/dL (ref 70–99)
Glucose-Capillary: 93 mg/dL (ref 70–99)
Glucose-Capillary: 98 mg/dL (ref 70–99)
Glucose-Capillary: 98 mg/dL (ref 70–99)
Glucose-Capillary: 117 mg/dL — ABNORMAL HIGH (ref 70–99)
Glucose-Capillary: 64 mg/dL — ABNORMAL LOW (ref 70–99)

## 2013-10-24 LAB — PREPARE FRESH FROZEN PLASMA

## 2013-10-24 LAB — PREPARE PLATELET PHERESIS: Unit division: 0

## 2013-10-24 LAB — CBC
HCT: 35.4 % — ABNORMAL LOW (ref 39.0–52.0)
HEMATOCRIT: 35.4 % — AB (ref 39.0–52.0)
HEMOGLOBIN: 12.4 g/dL — AB (ref 13.0–17.0)
HEMOGLOBIN: 12.1 g/dL — AB (ref 13.0–17.0)
MCH: 33.2 pg (ref 26.0–34.0)
MCH: 32.5 pg (ref 26.0–34.0)
MCHC: 35 g/dL (ref 30.0–36.0)
MCHC: 34.2 g/dL (ref 30.0–36.0)
MCV: 94.7 fL (ref 78.0–100.0)
MCV: 95.2 fL (ref 78.0–100.0)
Platelets: 178 10*3/uL (ref 150–400)
Platelets: 149 10*3/uL — ABNORMAL LOW (ref 150–400)
RBC: 3.74 MIL/uL — ABNORMAL LOW (ref 4.22–5.81)
RBC: 3.72 MIL/uL — AB (ref 4.22–5.81)
RDW: 12.8 % (ref 11.5–15.5)
RDW: 12.8 % (ref 11.5–15.5)
WBC: 13.1 10*3/uL — AB (ref 4.0–10.5)
WBC: 9.3 10*3/uL (ref 4.0–10.5)

## 2013-10-24 LAB — POCT I-STAT, CHEM 8
BUN: 18 mg/dL (ref 6–23)
Calcium, Ion: 1.2 mmol/L (ref 1.13–1.30)
Chloride: 98 mEq/L (ref 96–112)
Creatinine, Ser: 1.1 mg/dL (ref 0.50–1.35)
Glucose, Bld: 100 mg/dL — ABNORMAL HIGH (ref 70–99)
HCT: 35 % — ABNORMAL LOW (ref 39.0–52.0)
Hemoglobin: 11.9 g/dL — ABNORMAL LOW (ref 13.0–17.0)
Potassium: 4.1 mEq/L (ref 3.7–5.3)
Sodium: 133 mEq/L — ABNORMAL LOW (ref 137–147)
TCO2: 24 mmol/L (ref 0–100)

## 2013-10-24 LAB — BASIC METABOLIC PANEL
Anion gap: 10 (ref 5–15)
BUN: 19 mg/dL (ref 6–23)
CHLORIDE: 103 meq/L (ref 96–112)
CO2: 22 mEq/L (ref 19–32)
Calcium: 8.3 mg/dL — ABNORMAL LOW (ref 8.4–10.5)
Creatinine, Ser: 1.09 mg/dL (ref 0.50–1.35)
GFR, EST AFRICAN AMERICAN: 83 mL/min — AB (ref >90)
GFR, EST NON AFRICAN AMERICAN: 71 mL/min — AB (ref >90)
GLUCOSE: 99 mg/dL (ref 70–99)
POTASSIUM: 4.6 meq/L (ref 3.7–5.3)
SODIUM: 135 meq/L — AB (ref 137–147)

## 2013-10-24 LAB — MAGNESIUM
MAGNESIUM: 2.4 mg/dL (ref 1.5–2.5)
MAGNESIUM: 2.1 mg/dL (ref 1.5–2.5)

## 2013-10-24 LAB — BLOOD GAS, ARTERIAL
Acid-base deficit: 2.1 mmol/L — ABNORMAL HIGH (ref 0.0–2.0)
Bicarbonate: 22.4 mEq/L (ref 20.0–24.0)
Drawn by: 41977
O2 Content: 3 L/min
O2 Saturation: 96.4 %
Patient temperature: 98.6
TCO2: 23.6 mmol/L (ref 0–100)
pCO2 arterial: 40.2 mmHg (ref 35.0–45.0)
pH, Arterial: 7.365 (ref 7.350–7.450)
pO2, Arterial: 83.8 mmHg (ref 80.0–100.0)

## 2013-10-24 LAB — CREATININE, SERUM
CREATININE: 1.06 mg/dL (ref 0.50–1.35)
GFR calc Af Amer: 86 mL/min — ABNORMAL LOW (ref >90)
GFR calc non Af Amer: 74 mL/min — ABNORMAL LOW (ref >90)

## 2013-10-24 MED ORDER — INSULIN ASPART 100 UNIT/ML ~~LOC~~ SOLN
0.0000 [IU] | SUBCUTANEOUS | Status: DC
  Administered 2013-10-24: 0.4 [IU] via SUBCUTANEOUS

## 2013-10-24 MED ORDER — FUROSEMIDE 10 MG/ML IJ SOLN
20.0000 mg | Freq: Two times a day (BID) | INTRAMUSCULAR | Status: DC
  Administered 2013-10-24: 20 mg via INTRAVENOUS
  Administered 2013-10-24: 09:00:00 via INTRAVENOUS
  Filled 2013-10-24 (×2): qty 2

## 2013-10-24 MED ORDER — ALPRAZOLAM 0.5 MG PO TABS
1.0000 mg | ORAL_TABLET | Freq: Three times a day (TID) | ORAL | Status: DC | PRN
  Administered 2013-10-24 (×2): 0.5 mg via ORAL
  Administered 2013-10-24 – 2013-10-26 (×4): 1 mg via ORAL
  Filled 2013-10-24 (×6): qty 2

## 2013-10-24 MED ORDER — NICOTINE 21 MG/24HR TD PT24
21.0000 mg | MEDICATED_PATCH | Freq: Every day | TRANSDERMAL | Status: DC
  Administered 2013-10-24 – 2013-10-29 (×7): 21 mg via TRANSDERMAL
  Filled 2013-10-24 (×6): qty 1

## 2013-10-24 NOTE — Progress Notes (Signed)
1 Day Post-Op Procedure(s) (LRB): AORTIC VALVE REPLACEMENT (AVR) (N/A) INTRAOPERATIVE TRANSESOPHAGEAL ECHOCARDIOGRAM (N/A) Subjective: Doing well after aortic valve replacement for severe aortic stenosis-biologic prosthetic valve Maintaining normal sinus rhythm Able to get out of bed to chair Chest tubes removed today Pain control adequate with Toradol, oxycodone Anxiety treated with Xanax COPD with active smoking-pulmonary function stable with Xopenex and Symbicort  Objective: Vital signs in last 24 hours: Temp:  [97.5 F (36.4 C)-100.6 F (38.1 C)] 97.5 F (36.4 C) (10/17 1500) Pulse Rate:  [68-91] 70 (10/17 1300) Cardiac Rhythm:  [-] Atrial paced (10/17 1200) Resp:  [10-30] 19 (10/17 1300) BP: (82-127)/(44-84) 101/61 mmHg (10/17 1300) SpO2:  [94 %-100 %] 96 % (10/17 1300) Arterial Line BP: (81-160)/(47-78) 116/61 mmHg (10/17 1000) FiO2 (%):  [40 %] 40 % (10/16 1800) Weight:  [260 lb 12.9 oz (118.3 kg)] 260 lb 12.9 oz (118.3 kg) (10/17 0500)  Hemodynamic parameters for last 24 hours: PAP: (21-42)/(10-30) 42/30 mmHg CO:  [5.1 L/min-10.8 L/min] 5.7 L/min CI:  [2.2 L/min/m2-4.5 L/min/m2] 3 L/min/m2  Intake/Output from previous day: 10/16 0701 - 10/17 0700 In: 6966.9 [I.V.:3843.9; Blood:1773; IV Piggyback:1350] Out: 4500 [Urine:2230; Blood:1850; Chest Tube:420] Intake/Output this shift: Total I/O In: 366.5 [I.V.:66.5; IV Piggyback:300] Out: 790 [Urine:700; Chest Tube:90]  Alert and comfortable Soft flow murmur through the biologic aVR Good peripheral pulses Lungs clear Extremities warm Neuro intact  Lab Results:  Recent Labs  10/23/13 1945  10/24/13 0400 10/24/13 1625  WBC 10.0  --  13.1*  --   HGB 12.7*  < > 12.4* 11.9*  HCT 36.9*  < > 35.4* 35.0*  PLT 153  --  178  --   < > = values in this interval not displayed. BMET:  Recent Labs  10/24/13 0400 10/24/13 1625  NA 135* 133*  K 4.6 4.1  CL 103 98  CO2 22  --   GLUCOSE 99 100*  BUN 19 18   CREATININE 1.09 1.10  CALCIUM 8.3*  --     PT/INR:  Recent Labs  10/23/13 1300  LABPROT 17.4*  INR 1.41   ABG    Component Value Date/Time   PHART 7.365 10/24/2013 0311   HCO3 22.4 10/24/2013 0311   TCO2 24 10/24/2013 1625   ACIDBASEDEF 2.1* 10/24/2013 0311   O2SAT 96.4 10/24/2013 0311   CBG (last 3)   Recent Labs  10/24/13 1125 10/24/13 1229 10/24/13 1337  GLUCAP 114* 92 103*    Assessment/Plan: S/P Procedure(s) (LRB): AORTIC VALVE REPLACEMENT (AVR) (N/A) INTRAOPERATIVE TRANSESOPHAGEAL ECHOCARDIOGRAM (N/A) See progression orders   LOS: 1 day    VAN TRIGT III,PETER 10/24/2013

## 2013-10-24 NOTE — Op Note (Signed)
NAMTonye Pearson:  Benjamin Spears, Benjamin Spears             ACCOUNT NO.:  [id number]  MEDICAL RECORD NO.:  [id number]  LOCATION:  2S06C                        FACILITY:  [facility]  PHYSICIAN:  Kerin PernaPeter Van Trigt, M.D.  DATE OF BIRTH:  1950/03/07  DATE OF PROCEDURE:  10/23/2013 DATE OF DISCHARGE:                              OPERATIVE REPORT   OPERATION:  Aortic valve replacement with a 25-mm pericardial bovine tissue valve - Edwards 25-mm Magna Ease (serial number [id number]).  PREOPERATIVE DIAGNOSIS:  Critical aortic stenosis, bicuspid aortic valve.  POSTOPERATIVE DIAGNOSIS:  Critical aortic stenosis, bicuspid aortic valve.  SURGEON:  Kerin PernaPeter Van Trigt, M.D.  ASSISTANT:  Doree Fudgeonielle Zimmerman, PA-C.  ANESTHESIA:  General by Dr. Claybon Jabsaniel Singer.  INDICATIONS:  The patient is a 63 year old male who has been found to have critical aortic stenosis by echocardiogram.  He has been followed with intermittent serial echoes due to his history of bicuspid aortic valve disease.  He has developed some symptoms of decreasing exercise tolerance as well.  He underwent cardiac catheterization.  The catheter could not pass the valve.  The coronary arteries had no significant disease.  Aortic valve replacement was recommended by the patient's cardiologist.  I examined the patient in the office and reviewed the results of his echo and cardiac catheterization and agreed with the recommendation for aortic valve replacement.  I discussed the procedure in detail with the patient and his family.  It was the patient's wish to have a bioprosthetic valve placed to avoid lifelong commitment to Coumadin anticoagulation.  I discussed the major aspects of the procedure with the patient including the use of general anesthesia and cardiopulmonary bypass, the location of the surgical incision, and the expected postoperative recovery.  I discussed with the patient risks to him of the operation including the risks of stroke, MI, bleeding,  blood transfusion requirement, infection, postoperative pleural effusions, arrhythmias requiring permanent pacemaker placement, and death.  After reviewing these issues, he demonstrated his understanding and agreed to proceed with surgery under what I felt was an informed consent.  OPERATIVE FINDINGS: 1. Severely diseased dysplastic, thickened, narrowed aortic valve with     heavy calcification.  Exposure of the valve was somewhat difficult     due to the patient's cardiac enlargement and significant epicardial     fat of the RV outflow tract area. 2. Successful replacement of the aortic valve with a 25-mm tissue     valve with no significant gradient, no aortic insufficiency.  OPERATIVE PROCEDURE:  The patient was brought to the operating room and placed supine on the operating table where general anesthesia was induced under invasive hemodynamic monitoring.  The chest, abdomen, and legs were prepped with Betadine and draped as a sterile field.  A proper time-out was performed.  A sternal incision was made.  The sternum was divided with the saw.  The sternal retractor was placed.  The pericardium was opened and suspended.  Heparin was administered. Pursestrings were placed in the ascending aorta and right atrium, and after the ACT was documented as being therapeutic, the patient was cannulated and placed on cardiopulmonary bypass.  The ascending aorta was not enlarged.  A left ventricular vent was placed via the right  superior pulmonary vein.  Antegrade and retrograde cardioplegia catheters were placed through pursestrings for cold blood cardioplegia. The patient was cooled to 32 degrees.  The aortic crossclamp was applied.  One liter of cold blood cardioplegia was delivered in split doses between the antegrade aortic and retrograde coronary sinus catheters. There was good cardioplegic arrest with septal temperature dropped less than 14 degrees.  Cardioplegia was delivered every 20  minutes or less while the crossclamp was in place.  An aortotomy was performed.  The aortic valve was inspected.  It was extremely dysplastic, redundant, thickened and with calcification extending into the annulus.  The valve was excised and the annulus was debrided of all calcium.  This took considerable time to perform this carefully.  The annulus was sized to a 25-mm bioprosthetic Magna Ease valve.  Subannular 2-0 Ethibond sutures were placed around the annulus. Fifteen sutures were placed.  The valve was prepared according to protocol.  The sutures were placed through the sewing ring, the valve was lowered and sutures were tied.  The valve was deep into the aortic root and the sutures were tied carefully.  The valve was inspected and there was no evidence of spaces or potential for perivalvular leak.  The coronary ostium were widely patent.  It should be noted that the operative field was insufflated with carbon dioxide while the aorta was opened.  The valve was irrigated again and the aortotomy was closed in layers using running 4-0 Prolene with pledgets.  Air was vented from the coronaries on left side of the heart with usual de-airing maneuvers and a dose of retrograde warm blood cardioplegia. The patient was placed in steep Trendelenburg and the crossclamp was removed.  The heart was cardioverted back to a regular rhythm.  The vent was left on to remove any residual air.  The patient was rewarmed and reperfused. The aortotomy was hemostatic.  The temporary pacing wires were applied. The lungs were expanded.  The ventilator was resumed.  The patient had some residual air, which resolved.  The EKG changes with mean arterial pressure on bypass of 70 mmHg.  The patient was weaned successfully from cardiopulmonary bypass.  He was atrially paced.  Echo showed good functioning of the prosthesis without AI or gradient.  LV function was good.  Protamine was administered without  adverse reaction.  The cannulas were removed.  After reversal of heparin, there was still significant diffuse coagulopathy and the patient received some FFP and platelets.  The patient did have heavy alcohol history preoperatively.  The coagulation function improved.  The superior pericardium was closed over the aorta.  The anterior mediastinal and posterior mediastinal chest tubes were placed and brought out through separate incisions. The sternum was closed with interrupted steel wire.  The pectoralis fascia was closed with running Vicryl.  The subcutaneous and skin layers were closed with running Vicryl and sterile dressings were applied. Total cardiopulmonary bypass time was 162 minutes.     Kerin PernaPeter Van Trigt, M.D.     PV/MEDQ  D:  10/23/2013  T:  10/24/2013  Job:  [id number]  cc:   Verne Carrowhristopher McAlhany, MD

## 2013-10-25 ENCOUNTER — Inpatient Hospital Stay (HOSPITAL_COMMUNITY): Payer: 59

## 2013-10-25 LAB — GLUCOSE, CAPILLARY
Glucose-Capillary: 110 mg/dL — ABNORMAL HIGH (ref 70–99)
Glucose-Capillary: 117 mg/dL — ABNORMAL HIGH (ref 70–99)
Glucose-Capillary: 109 mg/dL — ABNORMAL HIGH (ref 70–99)
Glucose-Capillary: 106 mg/dL — ABNORMAL HIGH (ref 70–99)

## 2013-10-25 LAB — CBC
HCT: 35 % — ABNORMAL LOW (ref 39.0–52.0)
Hemoglobin: 12 g/dL — ABNORMAL LOW (ref 13.0–17.0)
MCH: 32.6 pg (ref 26.0–34.0)
MCHC: 34.3 g/dL (ref 30.0–36.0)
MCV: 95.1 fL (ref 78.0–100.0)
Platelets: 161 10*3/uL (ref 150–400)
RBC: 3.68 MIL/uL — ABNORMAL LOW (ref 4.22–5.81)
RDW: 12.7 % (ref 11.5–15.5)
WBC: 12.1 10*3/uL — ABNORMAL HIGH (ref 4.0–10.5)

## 2013-10-25 LAB — BASIC METABOLIC PANEL
Anion gap: 11 (ref 5–15)
BUN: 18 mg/dL (ref 6–23)
CO2: 24 mEq/L (ref 19–32)
Calcium: 8.8 mg/dL (ref 8.4–10.5)
Chloride: 99 mEq/L (ref 96–112)
Creatinine, Ser: 0.95 mg/dL (ref 0.50–1.35)
GFR calc Af Amer: >90 mL/min (ref >90)
GFR calc non Af Amer: 88 mL/min — ABNORMAL LOW (ref >90)
Glucose, Bld: 126 mg/dL — ABNORMAL HIGH (ref 70–99)
Potassium: 4.6 mEq/L (ref 3.7–5.3)
Sodium: 134 mEq/L — ABNORMAL LOW (ref 137–147)

## 2013-10-25 MED ORDER — GUAIFENESIN ER 600 MG PO TB12
600.0000 mg | ORAL_TABLET | Freq: Two times a day (BID) | ORAL | Status: DC
  Administered 2013-10-25 – 2013-10-29 (×9): 600 mg via ORAL
  Filled 2013-10-25 (×11): qty 1

## 2013-10-25 MED ORDER — SODIUM CHLORIDE 0.9 % IJ SOLN
3.0000 mL | Freq: Two times a day (BID) | INTRAMUSCULAR | Status: DC
  Administered 2013-10-25 – 2013-10-28 (×4): 3 mL via INTRAVENOUS

## 2013-10-25 MED ORDER — SPIRITUS FRUMENTI
1.0000 | Freq: Two times a day (BID) | ORAL | Status: DC
  Administered 2013-10-25 – 2013-10-27 (×3): 1 via ORAL
  Filled 2013-10-25 (×17): qty 1

## 2013-10-25 MED ORDER — ZOLPIDEM TARTRATE 5 MG PO TABS
10.0000 mg | ORAL_TABLET | Freq: Every evening | ORAL | Status: DC | PRN
  Administered 2013-10-26 (×2): 10 mg via ORAL
  Filled 2013-10-25 (×2): qty 2

## 2013-10-25 MED ORDER — LACTULOSE 10 GM/15ML PO SOLN
30.0000 g | Freq: Every day | ORAL | Status: DC | PRN
  Filled 2013-10-25: qty 45

## 2013-10-25 MED ORDER — SODIUM CHLORIDE 0.9 % IV SOLN
250.0000 mL | INTRAVENOUS | Status: DC | PRN
Start: 2013-10-25 — End: 2013-10-29

## 2013-10-25 MED ORDER — POTASSIUM CHLORIDE CRYS ER 20 MEQ PO TBCR
20.0000 meq | EXTENDED_RELEASE_TABLET | Freq: Every day | ORAL | Status: DC
  Administered 2013-10-25 – 2013-10-29 (×5): 20 meq via ORAL
  Filled 2013-10-25 (×5): qty 1

## 2013-10-25 MED ORDER — MAGNESIUM HYDROXIDE 400 MG/5ML PO SUSP: 30.0000 mL | Freq: Every day | ORAL | Status: DC | PRN

## 2013-10-25 MED ORDER — METOPROLOL TARTRATE 25 MG PO TABS
25.0000 mg | ORAL_TABLET | Freq: Two times a day (BID) | ORAL | Status: DC
  Administered 2013-10-25 – 2013-10-29 (×9): 25 mg via ORAL
  Filled 2013-10-25 (×11): qty 1

## 2013-10-25 MED ORDER — ENOXAPARIN SODIUM 40 MG/0.4ML ~~LOC~~ SOLN
40.0000 mg | SUBCUTANEOUS | Status: DC
  Administered 2013-10-25 – 2013-10-27 (×3): 40 mg via SUBCUTANEOUS
  Filled 2013-10-25 (×4): qty 0.4

## 2013-10-25 MED ORDER — METOCLOPRAMIDE HCL 5 MG/ML IJ SOLN
10.0000 mg | Freq: Four times a day (QID) | INTRAMUSCULAR | Status: DC | PRN
  Administered 2013-10-25: 10 mg via INTRAVENOUS
  Filled 2013-10-25 (×3): qty 2

## 2013-10-25 MED ORDER — METOCLOPRAMIDE HCL 5 MG/ML IJ SOLN
10.0000 mg | Freq: Four times a day (QID) | INTRAMUSCULAR | Status: AC
  Administered 2013-10-25 – 2013-10-27 (×8): 10 mg via INTRAVENOUS
  Filled 2013-10-25 (×8): qty 2

## 2013-10-25 MED ORDER — FENTANYL CITRATE 0.05 MG/ML IJ SOLN
25.0000 ug | INTRAMUSCULAR | Status: DC | PRN
  Administered 2013-10-25 (×2): 25 ug via INTRAVENOUS
  Filled 2013-10-25 (×2): qty 2

## 2013-10-25 MED ORDER — SODIUM CHLORIDE 0.9 % IJ SOLN: 3.0000 mL | INTRAMUSCULAR | Status: DC | PRN

## 2013-10-25 MED ORDER — FUROSEMIDE 10 MG/ML IJ SOLN
40.0000 mg | Freq: Two times a day (BID) | INTRAMUSCULAR | Status: AC
  Administered 2013-10-25 (×2): 40 mg via INTRAVENOUS
  Filled 2013-10-25: qty 4

## 2013-10-25 MED ORDER — MOVING RIGHT ALONG BOOK
Freq: Once | Status: AC
  Administered 2013-10-25: 10:00:00
  Filled 2013-10-25: qty 1

## 2013-10-25 MED ORDER — FUROSEMIDE 40 MG PO TABS
40.0000 mg | ORAL_TABLET | Freq: Every day | ORAL | Status: DC
  Administered 2013-10-26 – 2013-10-29 (×4): 40 mg via ORAL
  Filled 2013-10-25 (×4): qty 1

## 2013-10-25 MED ORDER — LEVALBUTEROL HCL 1.25 MG/0.5ML IN NEBU
1.2500 mg | INHALATION_SOLUTION | Freq: Three times a day (TID) | RESPIRATORY_TRACT | Status: DC
  Administered 2013-10-25 – 2013-10-29 (×9): 1.25 mg via RESPIRATORY_TRACT
  Filled 2013-10-25 (×14): qty 0.5

## 2013-10-25 NOTE — Progress Notes (Signed)
Pt transferred to 2W23.  Pt ambulated from 2S06 to 2W23 with wheelchair as steady.  Pt tolerated well.

## 2013-10-25 NOTE — Progress Notes (Signed)
2 Days Post-Op Procedure(s) (LRB): AORTIC VALVE REPLACEMENT (AVR) (N/A) INTRAOPERATIVE TRANSESOPHAGEAL ECHOCARDIOGRAM (N/A) Subjective: Did not sleep well CXR with low lung vols nsr  Objective: Vital signs in last 24 hours: Temp:  [97.5 F (36.4 C)-98.4 F (36.9 C)] 98 F (36.7 C) (10/18 0400) Pulse Rate:  [68-90] 81 (10/18 0800) Cardiac Rhythm:  [-] Normal sinus rhythm (10/18 0800) Resp:  [12-31] 20 (10/18 0800) BP: (101-151)/(50-103) 140/86 mmHg (10/18 0800) SpO2:  [93 %-100 %] 96 % (10/18 0800) Arterial Line BP: (116)/(61) 116/61 mmHg (10/17 1000) Weight:  [260 lb 2.3 oz (118 kg)] 260 lb 2.3 oz (118 kg) (10/18 0300)  Hemodynamic parameters for last 24 hours: PAP: (42)/(30) 42/30 mmHg  Intake/Output from previous day: 10/17 0701 - 10/18 0700 In: 1517 [P.O.:660; I.V.:257; IV Piggyback:600] Out: 1670 [Urine:1580; Chest Tube:90] Intake/Output this shift: Total I/O In: 10 [I.V.:10] Out: 45 [Urine:45]  No murmur Good pulses  Lab Results:  Recent Labs  10/24/13 1625 10/25/13 0345  WBC 9.3 12.1*  HGB 12.1*  11.9* 12.0*  HCT 35.4*  35.0* 35.0*  PLT 149* 161   BMET:  Recent Labs  10/24/13 0400 10/24/13 1625 10/25/13 0345  NA 135* 133* 134*  K 4.6 4.1 4.6  CL 103 98 99  CO2 22  --  24  GLUCOSE 99 100* 126*  BUN 19 18 18   CREATININE 1.09 1.06  1.10 0.95  CALCIUM 8.3*  --  8.8    PT/INR:  Recent Labs  10/23/13 1300  LABPROT 17.4*  INR 1.41   ABG    Component Value Date/Time   PHART 7.365 10/24/2013 0311   HCO3 22.4 10/24/2013 0311   TCO2 24 10/24/2013 1625   ACIDBASEDEF 2.1* 10/24/2013 0311   O2SAT 96.4 10/24/2013 0311   CBG (last 3)   Recent Labs  10/25/13 0026 10/25/13 0357 10/25/13 0819  GLUCAP 110* 117* 109*    Assessment/Plan: S/P Procedure(s) (LRB): AORTIC VALVE REPLACEMENT (AVR) (N/A) INTRAOPERATIVE TRANSESOPHAGEAL ECHOCARDIOGRAM (N/A) Plan for transfer to step-down: see transfer orders   LOS: 2 days    VAN TRIGT  III,Gem Conkle 10/25/2013

## 2013-10-26 ENCOUNTER — Inpatient Hospital Stay (HOSPITAL_COMMUNITY): Payer: 59

## 2013-10-26 DIAGNOSIS — Z952 Presence of prosthetic heart valve: Secondary | ICD-10-CM

## 2013-10-26 LAB — CBC
HCT: 33.9 % — ABNORMAL LOW (ref 39.0–52.0)
Hemoglobin: 11.6 g/dL — ABNORMAL LOW (ref 13.0–17.0)
MCH: 32.3 pg (ref 26.0–34.0)
MCHC: 34.2 g/dL (ref 30.0–36.0)
MCV: 94.4 fL (ref 78.0–100.0)
Platelets: 180 10*3/uL (ref 150–400)
RBC: 3.59 MIL/uL — ABNORMAL LOW (ref 4.22–5.81)
RDW: 12.7 % (ref 11.5–15.5)
WBC: 11.3 10*3/uL — ABNORMAL HIGH (ref 4.0–10.5)

## 2013-10-26 LAB — BASIC METABOLIC PANEL
Anion gap: 11 (ref 5–15)
BUN: 21 mg/dL (ref 6–23)
CO2: 27 mEq/L (ref 19–32)
Calcium: 9.5 mg/dL (ref 8.4–10.5)
Chloride: 92 mEq/L — ABNORMAL LOW (ref 96–112)
Creatinine, Ser: 1.14 mg/dL (ref 0.50–1.35)
GFR calc Af Amer: 78 mL/min — ABNORMAL LOW (ref >90)
GFR calc non Af Amer: 68 mL/min — ABNORMAL LOW (ref >90)
Glucose, Bld: 109 mg/dL — ABNORMAL HIGH (ref 70–99)
Potassium: 4.7 mEq/L (ref 3.7–5.3)
Sodium: 130 mEq/L — ABNORMAL LOW (ref 137–147)

## 2013-10-26 MED ORDER — AMIODARONE HCL IN DEXTROSE 360-4.14 MG/200ML-% IV SOLN
30.0000 mg/h | INTRAVENOUS | Status: DC
  Administered 2013-10-26 – 2013-10-27 (×2): 30 mg/h via INTRAVENOUS
  Filled 2013-10-26 (×8): qty 200

## 2013-10-26 MED ORDER — LACTULOSE 10 GM/15ML PO SOLN
20.0000 g | Freq: Once | ORAL | Status: AC
  Administered 2013-10-26: 20 g via ORAL
  Filled 2013-10-26: qty 30

## 2013-10-26 MED ORDER — ENSURE PUDDING PO PUDG
1.0000 | Freq: Three times a day (TID) | ORAL | Status: DC
  Administered 2013-10-26 – 2013-10-28 (×5): 1 via ORAL

## 2013-10-26 MED ORDER — AMIODARONE HCL IN DEXTROSE 360-4.14 MG/200ML-% IV SOLN
60.0000 mg/h | INTRAVENOUS | Status: AC
  Administered 2013-10-26: 60 mg/h via INTRAVENOUS
  Filled 2013-10-26: qty 200

## 2013-10-26 MED ORDER — FENTANYL CITRATE 0.05 MG/ML IJ SOLN: 25.0000 ug | INTRAMUSCULAR | Status: DC | PRN

## 2013-10-26 MED ORDER — ALPRAZOLAM 0.5 MG PO TABS
1.0000 mg | ORAL_TABLET | Freq: Two times a day (BID) | ORAL | Status: DC
  Administered 2013-10-26 – 2013-10-29 (×7): 1 mg via ORAL
  Filled 2013-10-26 (×7): qty 2

## 2013-10-26 MED ORDER — THIAMINE HCL 100 MG/ML IJ SOLN
100.0000 mg | Freq: Every day | INTRAMUSCULAR | Status: DC
  Administered 2013-10-26: 100 mg via INTRAVENOUS
  Filled 2013-10-26 (×2): qty 1

## 2013-10-26 MED FILL — Dexmedetomidine HCl IV Soln 200 MCG/2ML: INTRAVENOUS | Qty: 2 | Status: AC

## 2013-10-26 MED FILL — Heparin Sodium (Porcine) Inj 1000 Unit/ML: INTRAMUSCULAR | Qty: 30 | Status: AC

## 2013-10-26 MED FILL — Potassium Chloride Inj 2 mEq/ML: INTRAVENOUS | Qty: 40 | Status: AC

## 2013-10-26 MED FILL — Magnesium Sulfate Inj 50%: INTRAMUSCULAR | Qty: 10 | Status: AC

## 2013-10-26 NOTE — Discharge Instructions (Signed)
Aortic Valve Replacement, Care After °Refer to this sheet in the next few weeks. These instructions provide you with information on caring for yourself after your procedure. Your health care provider may also give you specific instructions. Your treatment has been planned according to current medical practices, but problems sometimes occur. Call your health care provider if you have any problems or questions after your procedure. °HOME CARE INSTRUCTIONS  °· Take medicines only as directed by your health care provider. °· If your health care provider has prescribed elastic stockings, wear them as directed. °· Take frequent naps or rest often throughout the day. °· Avoid lifting over 10 lbs (4.5 kg) or pushing or pulling things with your arms for 6-8 weeks or as directed by your health care provider. °· Avoid driving or airplane travel for 4-6 weeks after surgery or as directed by your health care provider. If you are riding in a car for an extended period, stop every 1-2 hours to stretch your legs. Keep a record of your medicines and medical history with you when traveling. °· Do not drive or operate heavy machinery while taking pain medicine. (narcotics). °· Do not cross your legs. °· Do not use any tobacco products including cigarettes, chewing tobacco, or electronic cigarettes. If you need help quitting, ask your health care provider. °· Do not take baths, swim, or use a hot tub until your health care provider approves. Take showers once your health care provider approves. Pat incisions dry. Do not rub incisions with a washcloth or towel. °· Avoid climbing stairs and using the handrail to pull yourself up for the first 2-3 weeks after surgery. °· Return to work as directed by your health care provider. °· Drink enough fluid to keep your urine clear or pale yellow. °· Do not strain to have a bowel movement. Eat high-fiber foods if you become constipated. You may also take a medicine to help you have a bowel  movement (laxative) as directed by your health care provider. °· Resume sexual activity as directed by your health care provider. Men should not use medicines for erectile dysfunction until their doctor says it is okay. °· If you had a certain type of heart condition in the past, you may need to take antibiotic medicine before having dental work or surgery. Let your dentist and health care providers know if you had one or more of the following: °¨ Previous endocarditis. °¨ An artificial (prosthetic) heart valve. °¨ Congenital heart disease. °SEEK MEDICAL CARE IF: °· You develop a skin rash.   °· You experience sudden changes in your weight. °· You have a fever. °SEEK IMMEDIATE MEDICAL CARE IF:  °· You develop chest pain that is not coming from your incision. °· You have drainage (pus), redness, swelling, or pain at your incision site.   °· You develop shortness of breath or have difficulty breathing.   °· You have increased bleeding from your incision site.   °· You develop light-headedness.   °MAKE SURE YOU:  °· Understand these directions. °· Will watch your condition. °· Will get help right away if you are not doing well or get worse. °Document Released: 07/13/2004 Document Revised: 05/11/2013 Document Reviewed: 10/09/2011 °Exit[employer]® Patient Information ©2015 [employer]. This information is not intended to replace advice given to you by your health care provider. Make sure you discuss any questions you have with your health care provider. ° °

## 2013-10-26 NOTE — Progress Notes (Signed)
Utilization Review Completed.Ulrick Methot T10/19/2015  

## 2013-10-26 NOTE — Progress Notes (Addendum)
      [address]       [place] [id number]             [phone]        3 Days Post-Op Procedure(s) (LRB): AORTIC VALVE REPLACEMENT (AVR) (N/A) INTRAOPERATIVE TRANSESOPHAGEAL ECHOCARDIOGRAM (N/A)  Subjective: Patient with some incisional tenderness and constipation this am.  Objective: Vital signs in last 24 hours: Temp:  [97.5 F (36.4 C)-98.6 F (37 C)] 97.5 F (36.4 C) (10/19 0400) Pulse Rate:  [73-85] 80 (10/19 0400) Cardiac Rhythm:  [-] Normal sinus rhythm (10/19 0400) Resp:  [9-20] 20 (10/19 0400) BP: (105-141)/(62-86) 122/85 mmHg (10/19 0400) SpO2:  [92 %-98 %] 98 % (10/19 0400) Weight:  [255 lb 3.2 oz (115.758 kg)] 255 lb 3.2 oz (115.758 kg) (10/19 0400)  Pre op weight 112 kg Current Weight  10/26/13 255 lb 3.2 oz (115.758 kg)      Intake/Output from previous day: 10/18 0701 - 10/19 0700 In: 310 [P.O.:240; I.V.:20; IV Piggyback:50] Out: 1850 [Urine:1850]   Physical Exam:  Cardiovascular: IRRR, systolic flow murmur Pulmonary: Diminished at bases; no rales, wheezes, or rhonchi. Abdomen: Soft, non tender, some distention,bowel sounds present. Extremities: Mild bilateral lower extremity edema. Wounds: Clean and dry.  No erythema or signs of infection.  Lab Results: CBC: Recent Labs  10/25/13 0345 10/26/13 0404  WBC 12.1* 11.3*  HGB 12.0* 11.6*  HCT 35.0* 33.9*  PLT 161 180   BMET:  Recent Labs  10/25/13 0345 10/26/13 0404  NA 134* 130*  K 4.6 4.7  CL 99 92*  CO2 24 27  GLUCOSE 126* 109*  BUN 18 21  CREATININE 0.95 1.14  CALCIUM 8.8 9.5    PT/INR:  Lab Results  Component Value Date   INR 1.41 10/23/2013   INR 0.98 10/20/2013   INR 0.9 08/31/2013   ABG:  INR: Will add last result for INR, ABG once components are confirmed Will add last 4 CBG results once components are confirmed  Assessment/Plan:  1. CV - PACs with HR in the 80's this am. On Lopressor 25 bid. 2.  Pulmonary - CXR this am shows elevation of  left hemidiaphragm, small bilateral pleural effusions and atelectasis.History of COPD. On 2 liters of oxygen via [place]-wean as tolerates. Continue Xopenex and Symbicort.Encourage incentive spirometer and flutter valve. 3. Volume Overload - On Lasix 40 daily. 4.  Acute blood loss anemia - H and H 11.6 and 33.9 5. Hyponatremia-Sodium 130 this am. Likely secondary to diuresis 6. LOC constipation 7. Remove EPW in am 8. Continue Fentanyl patch and Oxy PRN for pain control 9. Ensure tid 10. Likely discharge Wednesday  ZIMMERMAN,DONIELLE MPA-C 10/26/2013,7:40 AM  patient examined and medical record reviewed,agree with above note. VAN TRIGT III,PETER 10/26/2013

## 2013-10-26 NOTE — Progress Notes (Signed)
CARDIAC REHAB PHASE I   PRE:  Rate/Rhythm: 86 afib  BP:  Supine:   Sitting: 140/70  Standing:    SaO2: 86%RA, 89% 1L to 2L to get to 93%  MODE:  Ambulation: chair ft   [phone] Pt's wife attempting to get pt to sitting on side of bed. Pt stated in 10/10 pain left side where pacing wire taped. Stated he had gotten pain med but had never hurt like this and could not get comfortable. Pt requested straight back chair and not recliner. Put pt in straight back chair and arranged pillows for comfort. Put feet on pillow in chair. Pt did not have oxygen in his nose when I went in so checked RA sats. Took 2L to keep sats up. Notified pt's RN of his pain level. Encouraged IS.  Encouraged pt to walk with staff when feeling better. Wife in room.        Luetta Nuttingharlene Lebaron Bautch, RN BSN  10/26/2013 9:35 AM

## 2013-10-26 NOTE — Discharge Summary (Signed)
Physician Discharge Summary  Patient ID: Benjamin Spears MRN: [id number] DOB/AGE: 61/07/1950 61 y.o.  Admit date: 10/23/2013 Discharge date: 10/29/2013  Admission Diagnoses:  Patient Active Problem List   Diagnosis Date Noted  . S/P AVR (aortic valve replacement) 10/26/2013  . Aortic stenosis, severe 10/23/2013  . Aortic stenosis 06/30/2013  . Tobacco abuse 06/30/2013  . Heart murmur 05/27/2013   Discharge Diagnoses:   Patient Active Problem List   Diagnosis Date Noted  . S/P AVR (aortic valve replacement) 10/26/2013  . Aortic stenosis, severe 10/23/2013  . Aortic stenosis 06/30/2013  . Tobacco abuse 06/30/2013  . Heart murmur 05/27/2013  Post op paroxysmal atrial fibrillation  Discharged Condition: good  History of Present Illness:   Mr. Benjamin Spears is a 63 yo white male with known history of a heart murmur.  This was originally diagnosed 4-5 yrs ago, but the intensity has recently increased.  The patient was referred to Cardiology for evaluation.  Echocardiogram was obtained and showed severe Aortic Stenosis.  The patient was symptomatic with complains of decreased exercise tolerance and dyspnea on exertion.  He denied chest pain.  Further workup with cardiac catheterization was done and showed a preserved LV function and no significant CAD.  It was felt he would benefit from Aortic Valve Replacement and he was subsequently referred to [facility] for surgical consult.  He was evaluated by Dr. Donata ClayVan Trigt on 09/16/2013 at which time it was felt he would benefit from Aortic Valve Replacement.  He was instructed he would need to decrease smoking prior to surgery.  The patient also had daily ingestion of alcohol and was encouraged to cut back prior to surgery.  His dental health was good as patient had been evaluated in the previous 6 months.  The patient again presented for follow up with Dr. Morton PetersVan Tright on 10/14/2013.  He decreased his nicotine and alcohol intake and was felt appropriate to  proceed with surgery.  The risks and benefits of the procedure were explained to the patient and he was agreeable to proceed.      Hospital Course:   Mr. Benjamin Spears [facility]2013.  He was taken to the operating room and underwent Aortic Valve Replacement utilizing a 25 mm Magna Ease Pericardial Tissue Valve.  He tolerated the procedure without difficulty and was taken to the SICU in stable condition.  The patient was extubated the evening of surgery.  During his stay in the SICU the patient progressed with minimal difficulty.  His chest tubes and arterial lines were removed without difficulty.  The patient did not develop alcohol withdrawal symptoms, but was given beer at 2 meals to help avoid this.  He was ambulating with minimal difficulty in the SICU.  He was maintaining NSR and felt medically stable for transfer to the step down unit.    The patient continues to progress.  He went into a fib with RVR on 10/19. He was put on an Amiodarone drip. He converted to sinus rhythm and maintained NSR. He was then placed on oral Amiodarone. He then was PAF with heart rate into the 110's. He was placed on Digoxin. He has been in sinus rhythm the last 24 hours. He was not felt a good candidate for anticoagulation secondary to etoh history. Pacing wires were removed on 10/21.  He is having some difficulty with constipation which responded to treatment with a laxative and Reglan.  He continues to ambulate without difficulty.  He was requiring 2 liters  of oxygen via nasal cannula. He was able to be weaned to room air. He is tolerating a heart healthy diet. He is felt surgically stable for discharge today.       Significant Diagnostic Studies: cardiac graphics: Echocardiogram:   - Left ventricle: The cavity size was normal. Wall thickness was normal. Systolic function was normal. The estimated ejection fraction was in the range of 55% to 60%. Wall motion was normal; there were no  regional wall motion abnormalities. - Aortic valve: Normal-sized, moderately to severely calcified annulus. Functionally bicuspid; severely thickened, severely calcified leaflets. There was critical stenosis.  Treatments: surgery:   Aortic valve replacement with a 25-mm pericardial bovine tissue valve - Edwards 25-mm Magna Ease (serial number [id number]).  Disposition: 01-Home or Self Care  Discharge Medications:    Medication List         ALPRAZolam 0.5 MG tablet  Commonly known as:  XANAX  Take 0.5 mg by mouth 2 (two) times daily as needed for anxiety.     amiodarone 400 MG tablet  Commonly known as:  PACERONE  Take 1 tablet (400 mg total) by mouth 2 (two) times daily. For 2 days then take Amiodarone 200 mg by mouth two times daily thereafter.     aspirin 325 MG EC tablet  Take 1 tablet (325 mg total) by mouth daily.     budesonide-formoterol 160-4.5 MCG/ACT inhaler  Commonly known as:  SYMBICORT  Inhale 2 puffs into the lungs 2 (two) times daily.     digoxin 0.125 MG tablet  Commonly known as:  LANOXIN  Take 1 tablet (0.125 mg total) by mouth daily.     ferrous fumarate-b12-vitamic C-folic acid capsule  Commonly known as:  TRINSICON / FOLTRIN  Take 1 capsule by mouth daily. For one month then stop.     furosemide 40 MG tablet  Commonly known as:  LASIX  Take 1 tablet (40 mg total) by mouth daily. For 4 days then stop.     lisinopril 2.5 MG tablet  Commonly known as:  PRINIVIL,ZESTRIL  Take 1 tablet (2.5 mg total) by mouth daily.     metoprolol tartrate 25 MG tablet  Commonly known as:  LOPRESSOR  Take 1 tablet (25 mg total) by mouth 2 (two) times daily.     MUCUS RELIEF 400 MG Tabs tablet  Generic drug:  guaifenesin  Take 800 mg by mouth 2 (two) times daily.     nicotine 21 mg/24hr patch  Commonly known as:  NICODERM CQ - dosed in mg/24 hours  Place 1 patch (21 mg total) onto the skin daily.     oxyCODONE 5 MG immediate release tablet  Commonly known as:   Oxy IR/ROXICODONE  Take 1-2 tablets (5-10 mg total) by mouth every 4 (four) hours as needed for severe pain.     potassium chloride SA 20 MEQ tablet  Commonly known as:  K-DUR,KLOR-CON  Take 1 tablet (20 mEq total) by mouth daily. For 4 days then stop.       The patient has been discharged on:   1.Beta Blocker:  Yes [  x ]                              No   [   ]  If No, reason:  2.Ace Inhibitor/ARB: Yes [ x  ]                                     No  [   ]                                     If No, reason:   3.Statin:   Yes [   ]                  No  [  x ]                  If No, reason: No CAD  4.Marlowe Kays:  Yes  [ x  ]                  No   [   ]                  If No, reason:    Follow-up Information   Follow up with VAN Dinah Beers, MD On 11/25/2013. (Appointment is at 10:30)    Specialty:  Cardiothoracic Surgery   Contact information:   [address] [place] [address] [phone]       Follow up with [facility] On 11/25/2013. (Please get CXR at 9:30, located on first floor of [facility])    Contact information:   [place]       Follow up with Tereso Newcomer, PA-C On 11/13/2013. (Appointment is at 11:50)    Specialty:  Physician Assistant   Contact information:   [address] [place] [address] [phone]       Signed: Ardelle Balls PA-C 10/29/2013, 8:09 AM

## 2013-10-26 NOTE — Progress Notes (Signed)
Pt expressed that he has been feeling terrible today; unable to walk due to feeling bad; monitor currently shows afib-new; notified MD; ordered placed for Amio drip, suppository, Q30 vitals, external pacemaker; pt sitting in chair with call bell within reach; will continue to monitor.  Hermina BartersBOWMAN, Aleshka Corney M, RN

## 2013-10-27 ENCOUNTER — Inpatient Hospital Stay (HOSPITAL_COMMUNITY): Payer: 59

## 2013-10-27 ENCOUNTER — Encounter (HOSPITAL_COMMUNITY): Payer: Self-pay | Admitting: Cardiothoracic Surgery

## 2013-10-27 LAB — CBC
HCT: 31.8 % — ABNORMAL LOW (ref 39.0–52.0)
Hemoglobin: 11 g/dL — ABNORMAL LOW (ref 13.0–17.0)
MCH: 32.4 pg (ref 26.0–34.0)
MCHC: 34.6 g/dL (ref 30.0–36.0)
MCV: 93.8 fL (ref 78.0–100.0)
Platelets: 182 10*3/uL (ref 150–400)
RBC: 3.39 MIL/uL — ABNORMAL LOW (ref 4.22–5.81)
RDW: 12.6 % (ref 11.5–15.5)
WBC: 8.5 10*3/uL (ref 4.0–10.5)

## 2013-10-27 LAB — BASIC METABOLIC PANEL
Anion gap: 11 (ref 5–15)
BUN: 19 mg/dL (ref 6–23)
CO2: 28 mEq/L (ref 19–32)
Calcium: 9.2 mg/dL (ref 8.4–10.5)
Chloride: 95 mEq/L — ABNORMAL LOW (ref 96–112)
Creatinine, Ser: 0.87 mg/dL (ref 0.50–1.35)
GFR calc Af Amer: >90 mL/min (ref >90)
GFR calc non Af Amer: >90 mL/min (ref >90)
Glucose, Bld: 97 mg/dL (ref 70–99)
Potassium: 4 mEq/L (ref 3.7–5.3)
Sodium: 134 mEq/L — ABNORMAL LOW (ref 137–147)

## 2013-10-27 MED ORDER — VITAMIN B-1 100 MG PO TABS
100.0000 mg | ORAL_TABLET | Freq: Every day | ORAL | Status: DC
  Administered 2013-10-28 – 2013-10-29 (×2): 100 mg via ORAL
  Filled 2013-10-27 (×2): qty 1

## 2013-10-27 MED ORDER — FE FUMARATE-B12-VIT C-FA-IFC PO CAPS
1.0000 | ORAL_CAPSULE | Freq: Every day | ORAL | Status: DC
  Administered 2013-10-27 – 2013-10-29 (×3): 1 via ORAL
  Filled 2013-10-27 (×3): qty 1

## 2013-10-27 MED ORDER — AMIODARONE HCL 200 MG PO TABS
400.0000 mg | ORAL_TABLET | Freq: Two times a day (BID) | ORAL | Status: DC
  Administered 2013-10-27 – 2013-10-29 (×5): 400 mg via ORAL
  Filled 2013-10-27 (×7): qty 2

## 2013-10-27 NOTE — Progress Notes (Signed)
Ambulated 250 feet using front wheel walker on room air tolerated well,O2 saturation post ambulation is 96 %.

## 2013-10-27 NOTE — Progress Notes (Addendum)
      [address]       [place] [id number]             [phone]        4 Days Post-Op Procedure(s) (LRB): AORTIC VALVE REPLACEMENT (AVR) (N/A) INTRAOPERATIVE TRANSESOPHAGEAL ECHOCARDIOGRAM (N/A)  Subjective: Patient feeling better this am. Had a bowel movement.  Objective: Vital signs in last 24 hours: Temp:  [98.1 F (36.7 C)-98.5 F (36.9 C)] 98.5 F (36.9 C) (10/20 0500) Pulse Rate:  [54-88] 83 (10/20 0500) Cardiac Rhythm:  [-] Atrial fibrillation;Normal sinus rhythm (10/19 1950) Resp:  [18-22] 18 (10/20 0500) BP: (103-130)/(33-86) 123/59 mmHg (10/20 0500) SpO2:  [95 %-99 %] 95 % (10/20 0500) Weight:  [253 lb 1.4 oz (114.8 kg)] 253 lb 1.4 oz (114.8 kg) (10/20 0100)  Pre op weight 112 kg Current Weight  10/27/13 253 lb 1.4 oz (114.8 kg)      Intake/Output from previous day: 10/19 0701 - 10/20 0700 In: 0  Out: 500 [Urine:500]   Physical Exam:  Cardiovascular: RRR, systolic flow murmur Pulmonary: Diminished at bases; no rales, wheezes, or rhonchi. Abdomen: Soft, non tender, some distention,bowel sounds present. Extremities: Mild bilateral lower extremity edema. Wounds: Clean and dry.  No erythema or signs of infection.  Lab Results: CBC:  Recent Labs  10/26/13 0404 10/27/13 0503  WBC 11.3* 8.5  HGB 11.6* 11.0*  HCT 33.9* 31.8*  PLT 180 182   BMET:   Recent Labs  10/26/13 0404 10/27/13 0503  NA 130* 134*  K 4.7 4.0  CL 92* 95*  CO2 27 28  GLUCOSE 109* 97  BUN 21 19  CREATININE 1.14 0.87  CALCIUM 9.5 9.2    PT/INR:  Lab Results  Component Value Date   INR 1.41 10/23/2013   INR 0.98 10/20/2013   INR 0.9 08/31/2013   ABG:  INR: Will add last result for INR, ABG once components are confirmed Will add last 4 CBG results once components are confirmed  Assessment/Plan:  1. CV - A fib yesterday. Maintaining SR in the 70-80's this am. On Amiodarone drip and Lopressor 25 bid. Will restart low dose Lisinopril in am and  change Amiodarone to oral. Likely disconnect external pacer as HR above 70. 2.  Pulmonary - CXR this am appears similar to yesterday's (elevation of left hemidiaphragm, small bilateral pleural effusions and atelectasis, and dilated bowel).History of COPD. On 2 liters of oxygen via [place]-wean as tolerates. Continue Xopenex and Symbicort.Encourage incentive spirometer and flutter valve. 3. Volume Overload - On Lasix 40 daily. 4.  Acute blood loss anemia - H and H 11 and 31.8. Start Trinsicon 5. Hyponatremia-Sodium up to 134 this am. Likely secondary to diuresis 7. Remove EPW in am 8. Continue Reglan 9. Increase ambulation  ZIMMERMAN,DONIELLE MPA-C 10/27/2013,7:31 AM

## 2013-10-27 NOTE — Progress Notes (Signed)
Nursing note   Pt ambulated in hallway 350 feet x1 assist with walker. Tolerated well will monitor patient. Manda Holstad, Randall AnKristin Jessup RN

## 2013-10-27 NOTE — Progress Notes (Signed)
Came to visit patient at bedside on behalf of [employer] to [employer] program for [employer] employees/dependents with [employer] insurance. States his wife is a Runner, [employer] employee. Explained [employer] to [employer] program. Does not currently think he needs it. However, agreeable to post hospital discharge call. Left packet and contact information at bedside. Will make inpatient RNCM aware of visit.  Raiford NobleAtika Hall, MSN- RN,BSN- [facility] Hospital [phone]

## 2013-10-27 NOTE — Care Management Note (Unsigned)
    Page 1 of 1   10/27/2013     5:12:42 PM CARE MANAGEMENT NOTE 10/27/2013  Patient:  Benjamin Spears,Benjamin Spears   Account Number:  [id number]  Date Initiated:  10/27/2013  Documentation initiated by:  Jonda Alanis  Subjective/Objective Assessment:   Pt adm on 10/23/13 s/p AVR.  PTA, pt independent, lives with spouse.     Action/Plan:   Wife to provide care at dc.  Will follow for dc needs as pt progresses.   Anticipated DC Date:  10/28/2013   Anticipated DC Plan:  HOME/SELF CARE      DC Planning Services  CM consult      Choice offered to / List presented to:             Status of service:  In process, will continue to follow Medicare Important Message given?   (If response is "NO", the following Medicare IM given date fields will be blank) Date Medicare IM given:   Medicare IM given by:   Date Additional Medicare IM given:   Additional Medicare IM given by:    Discharge Disposition:    Per UR Regulation:  Reviewed for med. necessity/level of care/duration of stay  If discussed at Long Length of Stay Meetings, dates discussed:    Comments:

## 2013-10-27 NOTE — Progress Notes (Signed)
CARDIAC REHAB PHASE I   PRE:  Rate/Rhythm: 94 SR PACs  BP:  Supine:   Sitting: 140/79  Standing:    SaO2: 97% 2L   94%RA  MODE:  Ambulation: 500 ft   POST:  Rate/Rhythm: 88 SR PACs  BP:  Supine:   Sitting: 117/78  Standing:    SaO2: 86 %RA  Took 2L to keep sats 96% 1145-1226 Tried pt on RA during walk but took 2L to keep sats above 90% in hallway. Pt feeling better and walked 56700ft with rolling walker and asst x 1 with steady gait. Pt has runny nose and had to stop frequently to blow nose. Pt motivated to walk. Encouraged IS. To chair after walk.   Luetta Nuttingharlene Venia Riveron, RN BSN  10/27/2013 12:23 PM

## 2013-10-28 MED ORDER — BUDESONIDE-FORMOTEROL FUMARATE 160-4.5 MCG/ACT IN AERO: 2.0000 | INHALATION_SPRAY | Freq: Two times a day (BID) | RESPIRATORY_TRACT | Status: DC

## 2013-10-28 MED ORDER — NICOTINE 21 MG/24HR TD PT24: 21.0000 mg | MEDICATED_PATCH | Freq: Every day | TRANSDERMAL | Status: DC

## 2013-10-28 MED ORDER — LISINOPRIL 2.5 MG PO TABS
2.5000 mg | ORAL_TABLET | Freq: Every day | ORAL | Status: DC
Start: 2013-10-28 — End: 2013-11-13

## 2013-10-28 MED ORDER — ASPIRIN 325 MG PO TBEC: 325.0000 mg | DELAYED_RELEASE_TABLET | Freq: Every day | ORAL | Status: AC

## 2013-10-28 MED ORDER — FE FUMARATE-B12-VIT C-FA-IFC PO CAPS: 1.0000 | ORAL_CAPSULE | Freq: Every day | ORAL | Status: DC

## 2013-10-28 MED ORDER — DIGOXIN 125 MCG PO TABS
0.1250 mg | ORAL_TABLET | Freq: Every day | ORAL | Status: DC
  Administered 2013-10-28 – 2013-10-29 (×2): 0.125 mg via ORAL
  Filled 2013-10-28 (×2): qty 1

## 2013-10-28 MED ORDER — FUROSEMIDE 40 MG PO TABS: 40.0000 mg | ORAL_TABLET | Freq: Every day | ORAL | Status: DC

## 2013-10-28 MED ORDER — POTASSIUM CHLORIDE CRYS ER 20 MEQ PO TBCR: 20.0000 meq | EXTENDED_RELEASE_TABLET | Freq: Every day | ORAL | Status: DC

## 2013-10-28 MED ORDER — LISINOPRIL 2.5 MG PO TABS
2.5000 mg | ORAL_TABLET | Freq: Every day | ORAL | Status: DC
Start: 2013-10-28 — End: 2013-10-29
  Administered 2013-10-28 – 2013-10-29 (×2): 2.5 mg via ORAL
  Filled 2013-10-28 (×2): qty 1

## 2013-10-28 MED ORDER — AMIODARONE HCL 400 MG PO TABS: 400.0000 mg | ORAL_TABLET | Freq: Two times a day (BID) | ORAL | Status: DC

## 2013-10-28 MED ORDER — ENOXAPARIN SODIUM 40 MG/0.4ML ~~LOC~~ SOLN
40.0000 mg | SUBCUTANEOUS | Status: DC
  Administered 2013-10-29: 40 mg via SUBCUTANEOUS
  Filled 2013-10-28: qty 0.4

## 2013-10-28 MED ORDER — METOPROLOL TARTRATE 25 MG PO TABS: 25.0000 mg | ORAL_TABLET | Freq: Two times a day (BID) | ORAL | Status: DC

## 2013-10-28 MED ORDER — DIGOXIN 125 MCG PO TABS: 0.1250 mg | ORAL_TABLET | Freq: Every day | ORAL | Status: DC

## 2013-10-28 MED ORDER — OXYCODONE HCL 5 MG PO TABS: 5.0000 mg | ORAL_TABLET | ORAL | Status: DC | PRN

## 2013-10-28 NOTE — Progress Notes (Addendum)
      [address]       [place] [id number]             [phone]        5 Days Post-Op Procedure(s) (LRB): AORTIC VALVE REPLACEMENT (AVR) (N/A) INTRAOPERATIVE TRANSESOPHAGEAL ECHOCARDIOGRAM (N/A)  Subjective: Patient feeling fairly well. Just finished walking. Would like to go home.  Objective: Vital signs in last 24 hours: Temp:  [97.6 F (36.4 C)-98.5 F (36.9 C)] 98.5 F (36.9 C) (10/21 0557) Pulse Rate:  [74-92] 81 (10/21 0557) Cardiac Rhythm:  [-] Normal sinus rhythm (10/21 0809) Resp:  [18] 18 (10/21 0557) BP: (114-140)/(54-84) 133/84 mmHg (10/21 0557) SpO2:  [93 %-100 %] 93 % (10/21 0557) Weight:  [246 lb 11.1 oz (111.9 kg)] 246 lb 11.1 oz (111.9 kg) (10/21 0326)  Pre op weight 112 kg Current Weight  10/28/13 246 lb 11.1 oz (111.9 kg)      Intake/Output from previous day: 10/20 0701 - 10/21 0700 In: -  Out: 800 [Urine:800]   Physical Exam:  Cardiovascular: RRR, systolic flow murmur Pulmonary: Diminished at left base; Right lung is clear. No rales, wheezes, or rhonchi. Abdomen: Soft, non tender, some distention,bowel sounds present. Extremities: Mild bilateral lower extremity edema. Wounds: Clean and dry.  No erythema or signs of infection.  Lab Results: CBC:  Recent Labs  10/26/13 0404 10/27/13 0503  WBC 11.3* 8.5  HGB 11.6* 11.0*  HCT 33.9* 31.8*  PLT 180 182   BMET:   Recent Labs  10/26/13 0404 10/27/13 0503  NA 130* 134*  K 4.7 4.0  CL 92* 95*  CO2 27 28  GLUCOSE 109* 97  BUN 21 19  CREATININE 1.14 0.87  CALCIUM 9.5 9.2    PT/INR:  Lab Results  Component Value Date   INR 1.41 10/23/2013   INR 0.98 10/20/2013   INR 0.9 08/31/2013   ABG:  INR: Will add last result for INR, ABG once components are confirmed Will add last 4 CBG results once components are confirmed  Assessment/Plan:  1. CV - A fib yesterday. Maintaining SR in the 90's this am. On Amiodarone 400 bid and Lopressor 25 bid. Will restart  low dose Lisinopril for better BP control. Went back into a fib with HR 110's with ambulation after initially being examined. Per Dr. Donata ClayVan Trigt, start Digoxin. Questionable if not good anticoagulation candidate secondary to etoh history. 2.  Pulmonary -  Weaned to room air. Continue Xopenex and Symbicort.Encourage incentive spirometer and flutter valve. 3. Volume Overload - On Lasix 40 daily. 4.  Acute blood loss anemia - H and H 11 and 31.8. Continue Trinsicon 5. Hyponatremia-Sodium up to 134 this am. Likely secondary to diuresis 7. Remove EPW  8. May transfer to regular 2000 bed 9. Likely discharge in am  ZIMMERMAN,DONIELLE MPA-C 10/28/2013,8:45 AM

## 2013-10-28 NOTE — Progress Notes (Signed)
Pt ambulated 54200ft in hallway. Darrel HooverWilson,Edmonia Gonser S 5:20 PM

## 2013-10-28 NOTE — Progress Notes (Signed)
CARDIAC REHAB PHASE I   PRE:  Rate/Rhythm: 87 SR  BP:  Supine:   Sitting: 158/79  Standing:    SaO2: 94%RA  MODE:  Ambulation: 550 ft   POST:  Rate/Rhythm: 92  BP:  Supine:   Sitting: 150/80  Standing:    SaO2: 90%RA 0825-0853 Pt walked 550 ft on RA with rolling walker and minimal asst. Checked sats during walk and stable at 90 % or above. Pt wants to go home. Encouraged IS and more walks today. Will follow up if for discharge.   Luetta Nuttingharlene Kamdon Reisig, RN BSN  10/28/2013 8:51 AM

## 2013-10-29 NOTE — Progress Notes (Signed)
[id number] Education completed with pt and wife who voiced understanding. Gave smoking cessation handouts and encouraged no smoking. Discussed CRP 2 but pt declined due to losing job and financial issues. Encouraged heart healthy eating. Encouraged IS and waking for pulmonary status. Luetta NuttingCharlene Lilburn Straw RN BSN 10/29/2013 10:02 AM

## 2013-10-29 NOTE — Progress Notes (Signed)
Pt discharged home with wife.  Discharge instructions given & reviewed Eduction discussed  IV dc'd  Tele dc'd   Morning meds given Pt discharged via wheelchair with volunteer services, all pt belongs at side.   Darrel HooverWilson,Amitai Delaughter S

## 2013-10-29 NOTE — Progress Notes (Signed)
      [address]       [place] [id number]             [phone]        6 Days Post-Op Procedure(s) (LRB): AORTIC VALVE REPLACEMENT (AVR) (N/A) INTRAOPERATIVE TRANSESOPHAGEAL ECHOCARDIOGRAM (N/A)  Subjective: Patient feeling fairly well and would like to go home.  Objective: Vital signs in last 24 hours: Temp:  [98.1 F (36.7 C)-98.6 F (37 C)] 98.2 F (36.8 C) (10/22 0533) Pulse Rate:  [73-92] 73 (10/22 0533) Cardiac Rhythm:  [-] Normal sinus rhythm (10/22 0734) Resp:  [17-18] 17 (10/22 0533) BP: (126-161)/(58-88) 126/58 mmHg (10/22 0533) SpO2:  [91 %-98 %] 93 % (10/22 0533) Weight:  [245 lb 1.6 oz (111.177 kg)] 245 lb 1.6 oz (111.177 kg) (10/22 0533)  Pre op weight 112 kg Current Weight  10/29/13 245 lb 1.6 oz (111.177 kg)      Intake/Output from previous day: 10/21 0701 - 10/22 0700 In: 360 [P.O.:360] Out: 150 [Urine:150]   Physical Exam:  Cardiovascular: RRR, systolic flow murmur Pulmonary: Diminished at left base; Right lung is clear. No rales, wheezes, or rhonchi. Abdomen: Soft, non tender, some distention,bowel sounds present. Extremities: Mild bilateral lower extremity edema. Wounds: Clean and dry.  No erythema or signs of infection.  Lab Results: CBC:  Recent Labs  10/27/13 0503  WBC 8.5  HGB 11.0*  HCT 31.8*  PLT 182   BMET:   Recent Labs  10/27/13 0503  NA 134*  K 4.0  CL 95*  CO2 28  GLUCOSE 97  BUN 19  CREATININE 0.87  CALCIUM 9.2    PT/INR:  Lab Results  Component Value Date   INR 1.41 10/23/2013   INR 0.98 10/20/2013   INR 0.9 08/31/2013   ABG:  INR: Will add last result for INR, ABG once components are confirmed Will add last 4 CBG results once components are confirmed  Assessment/Plan:  1. CV - A fib yesterday. Maintaining SR in the 80's this am. On Amiodarone 400 bid and Lopressor 25 bid, Lisinopril 2.5 daily, and Digoxin 0.125 daily. Not good anticoagulation candidate secondary to etoh  history. 2.  Pulmonary -  Weaned to room air. Continue Xopenex and Symbicort.Encourage incentive spirometer and flutter valve. 3. Volume Overload - On Lasix 40 daily. 4.  Acute blood loss anemia - H and H 11 and 31.8. Continue Trinsicon 5. Discharge  Sulema Braid MPA-C 10/29/2013,7:58 AM

## 2013-11-03 NOTE — Discharge Summary (Signed)
patient examined and medical record reviewed,agree with above note. VAN TRIGT III,Otho Michalik 11/03/2013   

## 2013-11-13 ENCOUNTER — Ambulatory Visit (INDEPENDENT_AMBULATORY_CARE_PROVIDER_SITE_OTHER): Payer: 59 | Admitting: Physician Assistant

## 2013-11-13 ENCOUNTER — Encounter: Payer: Self-pay | Admitting: Physician Assistant

## 2013-11-13 VITALS — BP 110/70 | HR 70 | Ht 74.0 in | Wt 237.0 lb

## 2013-11-13 DIAGNOSIS — Z952 Presence of prosthetic heart valve: Secondary | ICD-10-CM

## 2013-11-13 DIAGNOSIS — Z954 Presence of other heart-valve replacement: Secondary | ICD-10-CM

## 2013-11-13 DIAGNOSIS — I350 Nonrheumatic aortic (valve) stenosis: Secondary | ICD-10-CM

## 2013-11-13 DIAGNOSIS — I480 Paroxysmal atrial fibrillation: Secondary | ICD-10-CM

## 2013-11-13 DIAGNOSIS — I10 Essential (primary) hypertension: Secondary | ICD-10-CM

## 2013-11-13 DIAGNOSIS — I2510 Atherosclerotic heart disease of native coronary artery without angina pectoris: Secondary | ICD-10-CM

## 2013-11-13 MED ORDER — AMIODARONE HCL 200 MG PO TABS: 200.0000 mg | ORAL_TABLET | Freq: Every day | ORAL | Status: DC

## 2013-11-13 MED ORDER — LISINOPRIL 2.5 MG PO TABS: 2.5000 mg | ORAL_TABLET | Freq: Every day | ORAL | Status: DC

## 2013-11-13 MED ORDER — METOPROLOL TARTRATE 25 MG PO TABS
12.5000 mg | ORAL_TABLET | Freq: Two times a day (BID) | ORAL | Status: DC
Start: 2013-11-13 — End: 2014-03-17

## 2013-11-13 NOTE — Patient Instructions (Addendum)
START LISINOPRIL 2.5 MG 1 TABLET DAILY; RX SENT IN TO [facility]  DECREASE AMIODARONE TO 200 MG DAILY; NEW RX SENT IN  Your physician has requested that you have an echocardiogram. Echocardiography is a painless test that uses sound waves to create images of your heart. It provides your doctor with information about the size and shape of your heart and how well your heart's chambers and valves are working. This procedure takes approximately one hour. There are no restrictions for this procedure.  Your physician recommends that you schedule a follow-up appointment in: 6 WEEKS WITH DR. Clifton JamesMCALHANY

## 2013-11-13 NOTE — Progress Notes (Signed)
Cardiology Office Note   Date:  11/13/2013   ID:  Benjamin MunroeKurt R Edsall, DOB 10/13/1950, MRN [id number]  PCP:  Rudi HeapMOORE, DONALD, MD  Cardiologist:  Dr. Verne Carrowhristopher McAlhany     History of Present Illness: Benjamin Spears is a 63 y.o. male with a history of tobacco abuse (ongoing), alcohol abuse, thyroid mass s/p thyroidectomy and severe aortic valve stenosis.  Patient noted decreased exercise tolerance. Exercise stress test demonstrated ischemic ST changes. Cardiac catheterization was arranged and demonstrated no significant CAD. He was referred for AVR. He was admitted 10/16-10/22. He underwent bioprosthetic aortic valve replacement with Dr. Donata ClayVan Trigt.  Postoperative course was complicated by alcohol withdrawal. He was given beer with 2 meals to help avoid this. He developed atrial fibrillation and converted to NSR on amiodarone. He was discharged on a combination of metoprolol, digoxin and amiodarone for rate control. He returns for follow-up.  He continues to have chest soreness. He's been walking at least 30 minutes a day. He denies significant dyspnea. He's been sleeping in a chair secondary to pain. He denies edema. He denies syncope. He has noted some dizziness. His heart rate was noted to be in the 40s. He called the surgeon's office and his digoxin was stopped. He has decreased his metoprolol to 12.5 mg twice a day.  Studies:  - LHC (8/15):  Mid left main 10%, mid LAD 20%, OM1 20%, proximal, mid,distal RCA 20%  - Echo (6/15):  Mild LVH. EF 50% to 55%.No regional wall motion abnormalities. Grade 1 diastolic dysfunction.  Severe aortic stenosis (mean 62 mmHg). Mildly dilated aortic root, mild MR, severe LAE   - Carotid US (10/15):  Bilateral ICA 1-39%   Recent Labs/Images:  05/27/2013: LDL (calc) 99; TSH 1.880 10/20/2013: ALT 28 10/27/2013: BUN 19; Creatinine 0.87; Hemoglobin 11.0*; Potassium 4.0; Sodium 134*      Wt Readings from Last 3 Encounters:  11/13/13 237 lb (107.502 kg)    10/29/13 245 lb 1.6 oz (111.177 kg)  10/20/13 246 lb 9.6 oz (111.857 kg)     Past Medical History  Diagnosis Date  . Thyroid disease   . Aortic stenosis   . Hypertension     on meds  . Heart murmur   . Cough     for last month  . Shortness of breath   . Anxiety   . Blood dyscrasia     bruises easily    Current Outpatient Prescriptions  Medication Sig Dispense Refill  . ALPRAZolam (XANAX) 0.5 MG tablet Take 0.5 mg by mouth 2 (two) times daily as needed for anxiety.    Marland Kitchen. amiodarone (PACERONE) 200 MG tablet Take 1 tablet (200 mg total) by mouth daily. For 2 days then take Amiodarone 200 mg by mouth two times daily thereafter. 90 tablet 3  . aspirin EC 325 MG EC tablet Take 1 tablet (325 mg total) by mouth daily. 30 tablet 0  . budesonide-formoterol (SYMBICORT) 160-4.5 MCG/ACT inhaler Inhale 2 puffs into the lungs 2 (two) times daily. 1 Inhaler 0  . ferrous fumarate-b12-vitamic C-folic acid (TRINSICON / FOLTRIN) capsule Take 1 capsule by mouth daily. For one month then stop. 30 capsule 0  . guaifenesin (MUCUS RELIEF) 400 MG TABS tablet Take 800 mg by mouth 2 (two) times daily.    . metoprolol tartrate (LOPRESSOR) 25 MG tablet Take 0.5 tablets (12.5 mg total) by mouth 2 (two) times daily.    Marland Kitchen. oxyCODONE (OXY IR/ROXICODONE) 5 MG immediate release tablet Take 1-2 tablets (5-10 mg  total) by mouth every 4 (four) hours as needed for severe pain. 30 tablet 0  . lisinopril (PRINIVIL,ZESTRIL) 2.5 MG tablet Take 1 tablet (2.5 mg total) by mouth daily. 90 tablet 3   No current facility-administered medications for this visit.     Allergies:   Review of patient's allergies indicates no known allergies.   Social History:  The patient  reports that he has been smoking Cigarettes.  He has a 80 pack-year smoking history. He has never used smokeless tobacco. He reports that he drinks about 7.2 oz of alcohol per week. He reports that he does not use illicit drugs.   Family History:  The patient's  family history includes Atrial fibrillation in his mother; Cancer in his mother; Heart disease in his mother; Heart failure in his daughter. There is no history of Heart attack or Stroke.   ROS:  Please see the history of present illness.   No fever, cough.   All other systems reviewed and negative.    PHYSICAL EXAM: VS:  BP 110/70 mmHg  Pulse 70  Ht 6\' 2"  (1.88 m)  Wt 237 lb (107.502 kg)  BMI 30.42 kg/m2 Well nourished, well developed, in no acute distress HEENT: normal Neck:  no JVD Cardiac:  normal S1, S2;  RRR; 1/6 systolic murmurLSB Chest: median sternotomy well healed without erythema or discharge Lungs:   clear to auscultation bilaterally, no wheezing, rhonchi or rales Abd: soft, nontender, no hepatomegaly Ext:  no edema Skin: warm and dry Neuro:  CNs 2-12 intact, no focal abnormalities noted  EKG:  NSR, HR 70, normal axis, IVCD, T-wave inversions in 1, aVL      ASSESSMENT AND PLAN:  1.  Aortic stenosis S/P AVR:   He is doing well after recent AVR.      -  He understands the importance of SBE prophylaxis.      -  Arrange FU 2D echo.      -  He declines referral to cardiac rehab. 2.  Post-Op Atrial Fibrillation:  Maintaining NSR.      -  Decrease Amiodarone to 200 QD.  Plan to DC this at next FU visit.  Consider DC'ing sooner if HR remains too slow.  3.  Coronary artery disease:  No angina.  Continue ASA.  Consider adding statin Rx at FU. 4.  Essential hypertension:  Controlled.   Disposition:   FU with Dr. Verne Carrowhristopher McAlhany 6 weeks.    Signed, Brynda RimScott Zylee Marchiano, PA-C, MHS 11/13/2013 12:36 PM    [facility] [address], [place], [place]  [address] Phone: [phone]) [phone]; Fax: [phone]

## 2013-11-25 ENCOUNTER — Ambulatory Visit (INDEPENDENT_AMBULATORY_CARE_PROVIDER_SITE_OTHER): Payer: Self-pay | Admitting: Cardiothoracic Surgery

## 2013-11-25 ENCOUNTER — Ambulatory Visit
Admission: RE | Admit: 2013-11-25 | Discharge: 2013-11-25 | Disposition: A | Discharge status: Home / Self Care | Payer: 59 | Source: Ambulatory Visit | Attending: Cardiothoracic Surgery | Admitting: Cardiothoracic Surgery

## 2013-11-25 ENCOUNTER — Other Ambulatory Visit: Payer: Self-pay | Admitting: *Deleted

## 2013-11-25 ENCOUNTER — Encounter: Payer: Self-pay | Admitting: Cardiothoracic Surgery

## 2013-11-25 ENCOUNTER — Other Ambulatory Visit: Payer: Self-pay | Admitting: Cardiothoracic Surgery

## 2013-11-25 VITALS — BP 138/82 | HR 55 | Resp 16 | Ht 74.0 in | Wt 240.0 lb

## 2013-11-25 DIAGNOSIS — Z952 Presence of prosthetic heart valve: Secondary | ICD-10-CM

## 2013-11-25 DIAGNOSIS — G8918 Other acute postprocedural pain: Secondary | ICD-10-CM

## 2013-11-25 DIAGNOSIS — I350 Nonrheumatic aortic (valve) stenosis: Secondary | ICD-10-CM

## 2013-11-25 DIAGNOSIS — Z954 Presence of other heart-valve replacement: Secondary | ICD-10-CM

## 2013-11-25 MED ORDER — OXYCODONE HCL 5 MG PO CAPS: 5.0000 mg | ORAL_CAPSULE | ORAL | Status: DC | PRN

## 2013-11-25 NOTE — Progress Notes (Signed)
PCP is Rudi HeapMOORE, DONALD, MD Referring Provider is Kathleene HazelMcAlhany, Christopher D*  Chief Complaint  Patient presents with  . Routine Post Op    s/p  AVR 10/23/13 with a cxr    ZOX:WRUEAVWHPI:patient doing well one month after aortic valve replacement for severe AS with a bioprosthetic valve. The patient has maintainedsinus rhythm. We will stop the amiodarone. His incision is well-healed. He has postop expected incisional pain. He has insomnia typical after this operation. He has not resumed smoking cigarettes. The patient is walking half-mile daily. No symptoms of angina or heart failure   Past Medical History  Diagnosis Date  . Thyroid disease   . Aortic stenosis   . Hypertension     on meds  . Heart murmur   . Cough     for last month  . Shortness of breath   . Anxiety   . Blood dyscrasia     bruises easily    Past Surgical History  Procedure Laterality Date  . Thyroidectomy, partial    . Fracture surgery    . Wrist fracture surgery Right   . Nasal fracture surgery      as a child  . Aortic valve replacement N/A 10/23/2013    Procedure: AORTIC VALVE REPLACEMENT (AVR);  Surgeon: Kerin PernaPeter Van Trigt, MD;  [facility];  Service: Open Heart Surgery;  Laterality: N/A;  . Intraoperative transesophageal echocardiogram N/A 10/23/2013    Procedure: INTRAOPERATIVE TRANSESOPHAGEAL ECHOCARDIOGRAM;  Surgeon: Kerin PernaPeter Van Trigt, MD;  [facility];  Service: Open Heart Surgery;  Laterality: N/A;    Family History  Problem Relation Age of Onset  . Heart disease Mother     CHF  . Atrial fibrillation Mother   . Cancer Mother     colon  . Heart attack Neg Hx   . Stroke Neg Hx   . Heart failure Daughter     Social History History  Substance Use Topics  . Smoking status: Current Every Day Smoker -- 2.00 packs/day for 40 years    Types: Cigarettes  . Smokeless tobacco: Never Used  . Alcohol Use: 7.2 oz/week    12 Cans of beer per week     Comment: occasionally    Current Outpatient  Prescriptions  Medication Sig Dispense Refill  . ALPRAZolam (XANAX) 0.5 MG tablet Take 0.5 mg by mouth 2 (two) times daily as needed for anxiety.    Marland Kitchen. amiodarone (PACERONE) 200 MG tablet Take 1 tablet (200 mg total) by mouth daily. For 2 days then take Amiodarone 200 mg by mouth two times daily thereafter. 90 tablet 3  . aspirin EC 325 MG EC tablet Take 1 tablet (325 mg total) by mouth daily. 30 tablet 0  . budesonide-formoterol (SYMBICORT) 160-4.5 MCG/ACT inhaler Inhale 2 puffs into the lungs 2 (two) times daily. 1 Inhaler 0  . lisinopril (PRINIVIL,ZESTRIL) 2.5 MG tablet Take 1 tablet (2.5 mg total) by mouth daily. 90 tablet 3  . metoprolol tartrate (LOPRESSOR) 25 MG tablet Take 0.5 tablets (12.5 mg total) by mouth 2 (two) times daily.    Marland Kitchen. oxycodone (OXY-IR) 5 MG capsule Take 1-2 capsules (5-10 mg total) by mouth every 4 (four) hours as needed. 40 capsule 0   No current facility-administered medications for this visit.    No Known Allergies  Review of Systemsslowly improving strength No fever having some postop depression  BP 138/82 mmHg  Pulse 55  Resp 16  Ht 6\' 2"  (1.88 m)  Wt 240 lb (108.863 kg)  BMI 30.80 kg/m2  SpO2 99% Physical Exam Alert and comfortable Lungs clear Sternal incision well-healed Heart rhythm regular, soft flow murmur through prosthetic valve Peripheral pulses intact No edema  Diagnostic Tests: Chest x-ray clear  Impression: Doing well one month after aVR for severe aortic stenosis Patient can drive. Patient can do normal activities except for heavy lifting and can return to work with lifting limitations for the next 2 months.  Plan:return for followup in 2 months to assess progress Prescription for oxycodone and Xanax provided the patient for postoperative pain, postoperative anxiety-depression Stop amiodarone Increase lisinopril 5mg  daily

## 2013-12-14 ENCOUNTER — Ambulatory Visit (HOSPITAL_COMMUNITY): Payer: 59 | Attending: Family Medicine

## 2013-12-14 DIAGNOSIS — Z48812 Encounter for surgical aftercare following surgery on the circulatory system: Secondary | ICD-10-CM | POA: Insufficient documentation

## 2013-12-14 DIAGNOSIS — I350 Nonrheumatic aortic (valve) stenosis: Secondary | ICD-10-CM

## 2013-12-14 DIAGNOSIS — Z954 Presence of other heart-valve replacement: Secondary | ICD-10-CM

## 2013-12-14 DIAGNOSIS — Z952 Presence of prosthetic heart valve: Secondary | ICD-10-CM | POA: Diagnosis not present

## 2013-12-14 NOTE — Progress Notes (Signed)
2D Echo completed. 12/14/2013 

## 2013-12-15 ENCOUNTER — Telehealth: Payer: Self-pay | Admitting: *Deleted

## 2013-12-15 ENCOUNTER — Encounter: Payer: Self-pay | Admitting: Physician Assistant

## 2013-12-15 NOTE — Telephone Encounter (Signed)
Pt notified about echo results with verbal understanding. 

## 2013-12-17 ENCOUNTER — Encounter (HOSPITAL_COMMUNITY): Payer: Self-pay | Admitting: Cardiovascular Disease

## 2014-01-05 ENCOUNTER — Encounter: Payer: Self-pay | Admitting: Cardiovascular Disease

## 2014-01-05 ENCOUNTER — Ambulatory Visit (INDEPENDENT_AMBULATORY_CARE_PROVIDER_SITE_OTHER): Payer: 59 | Admitting: Cardiovascular Disease

## 2014-01-05 VITALS — BP 130/72 | HR 67 | Ht 74.0 in | Wt 247.8 lb

## 2014-01-05 DIAGNOSIS — I10 Essential (primary) hypertension: Secondary | ICD-10-CM

## 2014-01-05 DIAGNOSIS — Z952 Presence of prosthetic heart valve: Secondary | ICD-10-CM

## 2014-01-05 DIAGNOSIS — Z720 Tobacco use: Secondary | ICD-10-CM

## 2014-01-05 DIAGNOSIS — I2510 Atherosclerotic heart disease of native coronary artery without angina pectoris: Secondary | ICD-10-CM

## 2014-01-05 DIAGNOSIS — I480 Paroxysmal atrial fibrillation: Secondary | ICD-10-CM

## 2014-01-05 DIAGNOSIS — F419 Anxiety disorder, unspecified: Secondary | ICD-10-CM

## 2014-01-05 DIAGNOSIS — I350 Nonrheumatic aortic (valve) stenosis: Secondary | ICD-10-CM

## 2014-01-05 DIAGNOSIS — Z954 Presence of other heart-valve replacement: Secondary | ICD-10-CM

## 2014-01-05 MED ORDER — ALPRAZOLAM 0.25 MG PO TABS: 0.2500 mg | ORAL_TABLET | Freq: Three times a day (TID) | ORAL | Status: DC | PRN

## 2014-01-05 MED ORDER — ATORVASTATIN CALCIUM 10 MG PO TABS: 10.0000 mg | ORAL_TABLET | Freq: Every day | ORAL | Status: AC

## 2014-01-05 NOTE — Patient Instructions (Addendum)
Your physician wants you to follow-up in: 6 months. You will receive a reminder letter in the mail two months in advance. If you don't receive a letter, please call our office to schedule the follow-up appointment.  Your physician recommends that you return for fasting lab work in:  12 weeks. --Scheduled for March 30, 2014.  The lab opens at 7:30 AM every week day.  Your physician has recommended you make the following change in your medication:  Start Atorvastatin 10 mg by mouth daily at bedtime.

## 2014-01-05 NOTE — Progress Notes (Signed)
History of Present Illness: 63 y.o. male with a history of tobacco abuse (ongoing), alcohol abuse, thyroid mass s/p thyroidectomy and severe aortic valve stenosis s/p AVR October 2015 who is here today for cardiac follow up. I met him in June 2015. Patient noted decreased exercise tolerance. Exercise stress test demonstrated ischemic ST changes. Echo with severe AS. Cardiac catheterization was arranged and demonstrated mild CAD. He was referred for AVR. He was admitted 10/16-10/22/15 and underwent bioprosthetic aortic valve replacement per Dr. Prescott Gum. Postoperative course was complicated by alcohol withdrawal. He developed atrial fibrillation and converted to NSR on amiodarone. He was discharged on a combination of metoprolol, digoxin and amiodarone for rate control but his digoxin has been stopped. His metoprolol has been reduced. Echo 12/14/13 with normally functioning AV, normal LVEF.   He returns for follow-up. He has no chest pain or SOB or palpitations. He continues to smoke 1/2 ppd.    Primary Care Physician: Redge Gainer  Last Lipid Profile:Lipid Panel     Component Value Date/Time   TRIG 151* 05/27/2013 1020   HDL 57 05/27/2013 1020   CHOLHDL 3.3 05/27/2013 1020   LDLCALC 99 05/27/2013 1020     Past Medical History  Diagnosis Date  . Thyroid disease   . Aortic stenosis   . Hypertension     on meds  . Heart murmur   . Cough     for last month  . Shortness of breath   . Anxiety   . Blood dyscrasia     bruises easily  . Hx of echocardiogram     post AVR >> Echo (12/15):  Mod LVH, EF 55-60%, no RWMA, Gr 1 DD, AVR ok, MAC, severe LAE    Past Surgical History  Procedure Laterality Date  . Thyroidectomy, partial    . Fracture surgery    . Wrist fracture surgery Right   . Nasal fracture surgery      as a child  . Aortic valve replacement N/A 10/23/2013    Procedure: AORTIC VALVE REPLACEMENT (AVR);  Surgeon: Ivin Poot, MD;  Location: [place];  Service: Open  Heart Surgery;  Laterality: N/A;  . Intraoperative transesophageal echocardiogram N/A 10/23/2013    Procedure: INTRAOPERATIVE TRANSESOPHAGEAL ECHOCARDIOGRAM;  Surgeon: Ivin Poot, MD;  Location: [place];  Service: Open Heart Surgery;  Laterality: N/A;  . Left and right heart catheterization with coronary angiogram N/A 09/04/2013    Procedure: LEFT AND RIGHT HEART CATHETERIZATION WITH CORONARY ANGIOGRAM;  Surgeon: Burnell Blanks, MD;  [facility] CATH LAB;  Service: Cardiovascular;  Laterality: N/A;    Current Outpatient Prescriptions  Medication Sig Dispense Refill  . aspirin EC 325 MG EC tablet Take 1 tablet (325 mg total) by mouth daily. 30 tablet 0  . lisinopril (PRINIVIL,ZESTRIL) 2.5 MG tablet Take 1 tablet (2.5 mg total) by mouth daily. 90 tablet 3  . metoprolol tartrate (LOPRESSOR) 25 MG tablet Take 0.5 tablets (12.5 mg total) by mouth 2 (two) times daily.     No current facility-administered medications for this visit.    No Known Allergies  History   Social History  . Marital Status: Married    Spouse Name: N/A    Number of Children: N/A  . Years of Education: N/A   Occupational History  . Works in a Proofreader and drives delivery truck    Social History Main Topics  . Smoking status: Current Every Day Smoker -- 2.00 packs/day for 40 years  Types: Cigarettes  . Smokeless tobacco: Never Used  . Alcohol Use: 7.2 oz/week    12 Cans of beer per week     Comment: occasionally  . Drug Use: No  . Sexual Activity: Not on file   Other Topics Concern  . Not on file   Social History Narrative    Family History  Problem Relation Age of Onset  . Heart disease Mother     CHF  . Atrial fibrillation Mother   . Cancer Mother     colon  . Heart attack Neg Hx   . Stroke Neg Hx   . Heart failure Daughter     Review of Systems:  As stated in the HPI and otherwise negative.   BP 130/72 mmHg  Pulse 67  Ht $R'6\' 2"'wb$  (1.88 m)  Wt 247 lb 12.8 oz (112.401 kg)  BMI  31.80 kg/m2  SpO2 98%  Physical Examination: General: Well developed, well nourished, NAD HEENT: OP clear, mucus membranes moist SKIN: warm, dry. No rashes. Neuro: No focal deficits Musculoskeletal: Muscle strength 5/5 all ext Psychiatric: Mood and affect normal Neck: No JVD, no carotid bruits, no thyromegaly, no lymphadenopathy. Lungs:Clear bilaterally, no wheezes, rhonci, crackles Cardiovascular: Regular rate and rhythm. No murmurs, gallops or rubs. Abdomen:Soft. Bowel sounds present. Non-tender.  Extremities: No lower extremity edema. Pulses are 2 + in the bilateral DP/PT.  Cardiac cath 09/04/13: Left main: 10% mid stenosis.  Left Anterior Descending Artery: Large caliber vessel that courses to the apex. 20% mid stenosis. Moderate caliber diagonal branch. No flow limiting lesions noted.  Circumflex Artery: Large caliber vessel with termination into a large obtuse marginal branch. 20% stenosis OM1. No flow limiting lesions noted.  Right Coronary Artery: Large dominant vessel with 20% proximal stenosis, 20% mid stenosis, 20% distal stenosis.  Left Ventricular Angiogram: Could not cross valve.   Echo 12/14/13: Left ventricle: The cavity size was normal. Wall thickness was increased in a pattern of moderate LVH. Systolic function was normal. The estimated ejection fraction was in the range of 55% to 60%. Wall motion was normal; there were no regional wall motion abnormalities. Doppler parameters are consistent with abnormal left ventricular relaxation (grade 1 diastolic dysfunction). The E/e&' ratio is <8, suggesting normal LV filling pressure. - Aortic valve: Bioprosthetic aortic valve, not well visualized. No evidence for obstruction, AT <100 ms, DVI <0.25. - Mitral valve: Calcified annulus. There was trivial regurgitation. - Left atrium: Severely dilated at 48 ml/m2. Impressions: - Compared to the prior echo in 06/2013, there is now a normally functioning  bioprosthetic aortic valve without obstruction.  Assessment and Plan:   1. Aortic valve stenosis: s/p bioprosthetic AVR. Will need antibiotic prophylaxis before dental visits.   2. CAD: No chest pains. Continue ASA and beta blocker. We will start Lipitor 10 mg po QHS.   3. Post op atrial fib (paroxysmal):  Sinus today. He is on beta blocker. Will not restart amiodarone.   4. HTN: BP stable.   5. Tobacco abuse: Smoking cessation advised.   6. Anxiety: Will give Xanax 0.25 mg po TID prn, no refills. To call primary care for further refills.

## 2014-01-26 ENCOUNTER — Other Ambulatory Visit: Payer: Self-pay | Admitting: *Deleted

## 2014-01-26 MED ORDER — LISINOPRIL 5 MG PO TABS: 5.0000 mg | ORAL_TABLET | Freq: Every day | ORAL | Status: DC

## 2014-01-27 ENCOUNTER — Encounter: Payer: Self-pay | Admitting: Cardiothoracic Surgery

## 2014-01-27 ENCOUNTER — Ambulatory Visit (INDEPENDENT_AMBULATORY_CARE_PROVIDER_SITE_OTHER): Payer: 59 | Admitting: Cardiothoracic Surgery

## 2014-01-27 VITALS — BP 145/86 | HR 60 | Resp 20 | Ht 74.0 in | Wt 250.0 lb

## 2014-01-27 DIAGNOSIS — I350 Nonrheumatic aortic (valve) stenosis: Secondary | ICD-10-CM

## 2014-01-27 DIAGNOSIS — Z954 Presence of other heart-valve replacement: Secondary | ICD-10-CM

## 2014-01-27 DIAGNOSIS — Z952 Presence of prosthetic heart valve: Secondary | ICD-10-CM

## 2014-01-27 NOTE — Progress Notes (Signed)
PCP is Rudi HeapMOORE, DONALD, MD Referring Provider is Ernestina PennaMoore, Donald W, MD  Chief Complaint  Patient presents with  . Routine Post Op    2 month f/u    ZOX:WRUEAHPI:final postop visit after aortic valve replacement with a 25 mm pericardial tissue valve for severe aortic stenosis. Patient doing well now back to work. No symptoms of chest pain or dyspnea. No palpitations or symptoms of arrhythmia.   Past Medical History  Diagnosis Date  . Thyroid disease   . Aortic stenosis   . Hypertension     on meds  . Heart murmur   . Cough     for last month  . Shortness of breath   . Anxiety   . Blood dyscrasia     bruises easily  . Hx of echocardiogram     post AVR >> Echo (12/15):  Mod LVH, EF 55-60%, no RWMA, Gr 1 DD, AVR ok, MAC, severe LAE    Past Surgical History  Procedure Laterality Date  . Thyroidectomy, partial    . Fracture surgery    . Wrist fracture surgery Right   . Nasal fracture surgery      as a child  . Aortic valve replacement N/A 10/23/2013    Procedure: AORTIC VALVE REPLACEMENT (AVR);  Surgeon: Kerin PernaPeter Van Trigt, MD;  [facility];  Service: Open Heart Surgery;  Laterality: N/A;  . Intraoperative transesophageal echocardiogram N/A 10/23/2013    Procedure: INTRAOPERATIVE TRANSESOPHAGEAL ECHOCARDIOGRAM;  Surgeon: Kerin PernaPeter Van Trigt, MD;  [facility];  Service: Open Heart Surgery;  Laterality: N/A;  . Left and right heart catheterization with coronary angiogram N/A 09/04/2013    Procedure: LEFT AND RIGHT HEART CATHETERIZATION WITH CORONARY ANGIOGRAM;  Surgeon: Kathleene Hazelhristopher D McAlhany, MD;  [facility];  Service: Cardiovascular;  Laterality: N/A;    Family History  Problem Relation Age of Onset  . Heart disease Mother     CHF  . Atrial fibrillation Mother   . Cancer Mother     colon  . Heart attack Neg Hx   . Stroke Neg Hx   . Heart failure Daughter     Social History History  Substance Use Topics  . Smoking status: Current Every Day Smoker -- 2.00 packs/day  for 40 years    Types: Cigarettes  . Smokeless tobacco: Never Used  . Alcohol Use: 7.2 oz/week    12 Cans of beer per week     Comment: occasionally    Current Outpatient Prescriptions  Medication Sig Dispense Refill  . ALPRAZolam (XANAX) 0.25 MG tablet Take 1 tablet (0.25 mg total) by mouth 3 (three) times daily as needed for anxiety. 30 tablet 0  . aspirin EC 325 MG EC tablet Take 1 tablet (325 mg total) by mouth daily. 30 tablet 0  . atorvastatin (LIPITOR) 10 MG tablet Take 1 tablet (10 mg total) by mouth at bedtime. 90 tablet 3  . lisinopril (PRINIVIL,ZESTRIL) 5 MG tablet Take 1 tablet (5 mg total) by mouth daily. 90 tablet 1  . metoprolol tartrate (LOPRESSOR) 25 MG tablet Take 0.5 tablets (12.5 mg total) by mouth 2 (two) times daily.     No current facility-administered medications for this visit.    No Known Allergies  Review of Systems  Patient has resumed smoking half pack per day Patient denies any symptoms of sternal malunion No symptoms of edema Note shortness of breath No dizziness or presyncope  BP 145/86 mmHg  Pulse 60  Resp 20  Ht 6'  2" (1.88 m)  Wt 250 lb (113.399 kg)  BMI 32.08 kg/m2  SpO2 97% Physical Exam Alert comfortable Normocephalic pupils equal dentition good Neck without adenopathy Sounds clear Heart rhythm regular with 1/6 flow murmur through the aortic valve in systole No abdominal tenderness No peripheral edema Neuro intact  Diagnostic Tests: none  Impression:  Patient in normal activitiesHe is encouraged to stop smoking completely He is encouraged to establish care with her primary care physician for his hypertension and cardiologist or long-term followup of his prosthetic valve The importance of total hygiene and antibiotic prophylaxis prior to dental procedures was again reviewed with the patient  Plan:return as needed

## 2014-03-17 ENCOUNTER — Other Ambulatory Visit: Payer: Self-pay

## 2014-03-17 MED ORDER — METOPROLOL TARTRATE 25 MG PO TABS: 12.5000 mg | ORAL_TABLET | Freq: Two times a day (BID) | ORAL | Status: DC

## 2014-03-17 NOTE — Telephone Encounter (Signed)
[facility]

## 2014-03-22 ENCOUNTER — Encounter: Payer: 59 | Admitting: Cardiovascular Disease

## 2014-03-22 NOTE — Progress Notes (Signed)
No show

## 2014-03-30 ENCOUNTER — Other Ambulatory Visit: Payer: 59

## 2014-04-16 ENCOUNTER — Ambulatory Visit (INDEPENDENT_AMBULATORY_CARE_PROVIDER_SITE_OTHER): Payer: 59

## 2014-04-16 ENCOUNTER — Encounter: Payer: Self-pay | Admitting: Nurse Practitioner

## 2014-04-16 ENCOUNTER — Ambulatory Visit (INDEPENDENT_AMBULATORY_CARE_PROVIDER_SITE_OTHER): Payer: 59 | Admitting: Nurse Practitioner

## 2014-04-16 VITALS — BP 107/67 | HR 80 | Temp 96.9°F

## 2014-04-16 DIAGNOSIS — R0989 Other specified symptoms and signs involving the circulatory and respiratory systems: Secondary | ICD-10-CM

## 2014-04-16 DIAGNOSIS — J189 Pneumonia, unspecified organism: Secondary | ICD-10-CM

## 2014-04-16 LAB — POCT INFLUENZA A/B
INFLUENZA A, POC: NEGATIVE
INFLUENZA B, POC: NEGATIVE

## 2014-04-16 MED ORDER — ALPRAZOLAM 0.25 MG PO TABS: 0.2500 mg | ORAL_TABLET | Freq: Three times a day (TID) | ORAL | Status: AC | PRN

## 2014-04-16 MED ORDER — BENZONATATE 100 MG PO CAPS: 100.0000 mg | ORAL_CAPSULE | Freq: Three times a day (TID) | ORAL | Status: DC | PRN

## 2014-04-16 MED ORDER — AZITHROMYCIN 250 MG PO TABS: ORAL_TABLET | ORAL | Status: DC

## 2014-04-16 NOTE — Patient Instructions (Signed)

## 2014-04-16 NOTE — Progress Notes (Signed)
  Subjective:     Benjamin Spears is a 64 y.o. male who presents for evaluation of sinus pain. Symptoms include: congestion, cough, nasal congestion, post nasal drip and sinus pressure. Onset of symptoms was 2 days ago. Symptoms have been gradually worsening since that time. Past history is significant for occasional episodes of bronchitis. Patient is a smoker  (2 ppd x 30 yrs). Fever 102 last  Night- has taken motrin which helped  The following portions of the patient's history were reviewed and updated as appropriate: allergies, current medications, past family history, past medical history, past social history, past surgical history and problem list.  Review of Systems Pertinent items are noted in HPI.   Objective:    BP 107/67 mmHg  Pulse 80  Temp(Src) 96.9 F (36.1 C) (Oral) General appearance: alert and cooperative Eyes: conjunctivae/corneas clear. PERRL, EOM's intact. Fundi benign. Ears: normal TM's and external ear canals both ears Nose: Nares normal. Septum midline. Mucosa normal. No drainage or sinus tenderness., green discharge, mild congestion, turbinates red, no sinus tenderness Throat: lips, mucosa, and tongue normal; teeth and gums normal Neck: no adenopathy, no carotid bruit, no JVD, supple, symmetrical, trachea midline and thyroid not enlarged, symmetric, no tenderness/mass/nodules Lungs: clear to auscultation bilaterally Heart: regular rate and rhythm  2/6 hearst murmur  Chest x ray- left lower lobe infiltrate Results for orders placed or performed in visit on 04/16/14  POCT Influenza A/B  Result Value Ref Range   Influenza A, POC Negative    Influenza B, POC Negative       Assessment:    left lower lobe pneumonia    Plan:  1. Take meds as prescribed 2. Use a cool mist humidifier especially during the winter months and when heat has been humid. 3. Use saline nose sprays frequently 4. Saline irrigations of the nose can be very helpful if done  frequently.  * 4X daily for 1 week*  * Use of a nettie pot can be helpful with this. Follow directions with this* 5. Drink plenty of fluids 6. Keep thermostat turn down low 7.For any cough or congestion  Use plain Mucinex- regular strength or max strength is fine   * Children- consult with Pharmacist for dosing 8. For fever or aces or pains- take tylenol or ibuprofen appropriate for age and weight.  * for fevers greater than 101 orally you may alternate ibuprofen and tylenol every  3 hours.   Meds ordered this encounter  Medications  . azithromycin (ZITHROMAX Z-PAK) 250 MG tablet    Sig: As directed    Dispense:  1 each    Refill:  0    Order Specific Question:  Supervising Provider    Answer:  Ernestina PennaMOORE, DONALD W [[id number]]  . benzonatate (TESSALON PERLES) 100 MG capsule    Sig: Take 1 capsule (100 mg total) by mouth 3 (three) times daily as needed for cough.    Dispense:  20 capsule    Refill:  0    Order Specific Question:  Supervising Provider    Answer:  Ernestina PennaMOORE, DONALD W [[id number]]   Mary-Margaret Daphine DeutscherMartin, FNP

## 2014-04-26 ENCOUNTER — Encounter: Payer: Self-pay | Admitting: Nurse Practitioner

## 2014-04-26 ENCOUNTER — Ambulatory Visit (INDEPENDENT_AMBULATORY_CARE_PROVIDER_SITE_OTHER): Payer: 59 | Admitting: Nurse Practitioner

## 2014-04-26 VITALS — BP 94/70 | HR 76 | Temp 98.1°F | Ht 74.0 in | Wt 244.0 lb

## 2014-04-26 DIAGNOSIS — N410 Acute prostatitis: Secondary | ICD-10-CM

## 2014-04-26 DIAGNOSIS — R509 Fever, unspecified: Secondary | ICD-10-CM | POA: Diagnosis not present

## 2014-04-26 DIAGNOSIS — R300 Dysuria: Secondary | ICD-10-CM | POA: Diagnosis not present

## 2014-04-26 DIAGNOSIS — N3000 Acute cystitis without hematuria: Secondary | ICD-10-CM

## 2014-04-26 DIAGNOSIS — R5081 Fever presenting with conditions classified elsewhere: Secondary | ICD-10-CM

## 2014-04-26 LAB — POCT INFLUENZA A/B
INFLUENZA A, POC: NEGATIVE
Influenza B, POC: NEGATIVE

## 2014-04-26 LAB — POCT UA - MICROSCOPIC ONLY
BACTERIA, U MICROSCOPIC: NEGATIVE
CASTS, UR, LPF, POC: NEGATIVE
Crystals, Ur, HPF, POC: NEGATIVE
Epithelial cells, urine per micros: NEGATIVE
Yeast, UA: NEGATIVE

## 2014-04-26 LAB — POCT CBC
Granulocyte percent: 88.8 %G — AB (ref 37–80)
HCT, POC: 45.3 % (ref 43.5–53.7)
HEMOGLOBIN: 13.9 g/dL — AB (ref 14.1–18.1)
Lymph, poc: 1.3 (ref 0.6–3.4)
MCH, POC: 29 pg (ref 27–31.2)
MCHC: 30.8 g/dL — AB (ref 31.8–35.4)
MCV: 94.2 fL (ref 80–97)
MPV: 6.8 fL (ref 0–99.8)
POC Granulocyte: 11.1 — AB (ref 2–6.9)
POC LYMPH %: 10.1 % (ref 10–50)
Platelet Count, POC: 263 10*3/uL (ref 142–424)
RBC: 4.81 M/uL (ref 4.69–6.13)
RDW, POC: 13 %
WBC: 12.5 10*3/uL — AB (ref 4.6–10.2)

## 2014-04-26 LAB — POCT URINALYSIS DIPSTICK
GLUCOSE UA: NEGATIVE
Nitrite, UA: NEGATIVE
PH UA: 6
Spec Grav, UA: 1.015
Urobilinogen, UA: NEGATIVE

## 2014-04-26 MED ORDER — SULFAMETHOXAZOLE-TRIMETHOPRIM 800-160 MG PO TABS: 1.0000 | ORAL_TABLET | Freq: Two times a day (BID) | ORAL | Status: DC

## 2014-04-26 MED ORDER — HYDROCODONE-ACETAMINOPHEN 5-325 MG PO TABS: 1.0000 | ORAL_TABLET | Freq: Four times a day (QID) | ORAL | Status: AC | PRN

## 2014-04-26 NOTE — Progress Notes (Signed)
Subjective:    Patient ID: Benjamin Spears, male    DOB: 05/23/1950, 64 y.o.   MRN: [id number]  HPI Patient was seen 10 days ago with fever and achiness- flu was negative- chest x ray was negative- he was given a z pak and his wife gave him 3 tamiflu. He comes in today c/o achiness allover , fever greater then 103. Urine red. Has urinary frequency and urgency. Denies cough or SOB.    Review of Systems  Constitutional: Positive for fever, chills, appetite change and fatigue.  HENT: Negative for congestion, ear pain, rhinorrhea and sinus pressure.   Respiratory: Negative for cough.   Cardiovascular: Negative.   Gastrointestinal: Positive for nausea.  Genitourinary: Positive for dysuria, urgency and frequency.  Neurological: Negative.   Psychiatric/Behavioral: Negative.        Objective:   Physical Exam  Constitutional: He is oriented to person, place, and time. He appears well-developed and well-nourished.  HENT:  Right Ear: External ear normal.  Left Ear: External ear normal.  Nose: Nose normal.  Mouth/Throat: Oropharynx is clear and moist.  Eyes: Pupils are equal, round, and reactive to light.  Neck: Normal range of motion. Neck supple.  Cardiovascular: Normal rate and regular rhythm.  Friction rub: 3/6.   Murmur heard. Pulmonary/Chest: Effort normal and breath sounds normal. He has no wheezes. He has no rales.  Abdominal: Soft. Bowel sounds are normal. He exhibits no distension and no mass. There is no tenderness. There is no rebound and no guarding.  Genitourinary:  bil CVA tenderness  Neurological: He is alert and oriented to person, place, and time.  Skin: Skin is warm and dry.  Psychiatric: He has a normal mood and affect. His behavior is normal. Judgment and thought content normal.   BP 94/70 mmHg  Spears 76  Temp(Src) 98.1 F (36.7 C) (Oral)  Ht 6\' 2"  (1.88 m)  Wt 244 lb (110.678 kg)  BMI 31.31 kg/m2  Results for orders placed or performed in visit on 04/26/14   POCT CBC  Result Value Ref Range   WBC 12.5 (A) 4.6 - 10.2 K/uL   Lymph, poc 1.3 0.6 - 3.4   POC LYMPH PERCENT 10.1 10 - 50 %L   POC Granulocyte 11.1 (A) 2 - 6.9   Granulocyte percent 88.8 (A) 37 - 80 %G   RBC 4.81 4.69 - 6.13 M/uL   Hemoglobin 13.9 (A) 14.1 - 18.1 g/dL   HCT, POC 13.045.3 86.543.5 - 53.7 %   MCV 94.2 80 - 97 fL   MCH, POC 29.0 27 - 31.2 pg   MCHC 30.8 (A) 31.8 - 35.4 g/dL   RDW, POC 78.413.0 %   Platelet Count, POC 263 142 - 424 K/uL   MPV 6.8 0 - 99.8 fL  POCT Influenza A/B  Result Value Ref Range   Influenza A, POC Negative    Influenza B, POC Negative   POCT urinalysis dipstick  Result Value Ref Range   Color, UA orange    Clarity, UA clear    Glucose, UA neg    Bilirubin, UA large    Ketones, UA trace    Spec Grav, UA 1.015    Blood, UA large    pH, UA 6.0    Protein, UA trace    Urobilinogen, UA negative    Nitrite, UA neg    Leukocytes, UA moderate (2+)   POCT UA - Microscopic Only  Result Value Ref Range   WBC, Ur, HPF, POC  20-25    RBC, urine, microscopic 5-10    Bacteria, U Microscopic neg    Mucus, UA moderate    Epithelial cells, urine per micros neg    Crystals, Ur, HPF, POC neg    Casts, Ur, LPF, POC neg    Yeast, UA neg           Assessment & Plan:   1. Fever presenting with conditions classified elsewhere   2. Dysuria   3. Acute prostatitis   4. Acute cystitis without hematuria    Meds ordered this encounter  Medications  . sulfamethoxazole-trimethoprim (BACTRIM DS) 800-160 MG per tablet    Sig: Take 1 tablet by mouth 2 (two) times daily.    Dispense:  56 tablet    Refill:  0    Order Specific Question:  Supervising Provider    Answer:  Ernestina Penna [[id number]]  . HYDROcodone-acetaminophen (LORTAB) 5-325 MG per tablet    Sig: Take 1 tablet by mouth every 6 (six) hours as needed for moderate pain.    Dispense:  20 tablet    Refill:  0    Order Specific Question:  Supervising Provider    Answer:  Ernestina Penna [[id number]]    Orders Placed This Encounter  Procedures  . Urine culture  . [place] spotted fvr abs pnl(IgG+IgM)  . Lyme Ab/Western Blot Reflex  . POCT CBC  . POCT Influenza A/B  . POCT urinalysis dipstick  . POCT UA - Microscopic Only   Force fluids Rest motrin otc for fever rto prn  Mary-Margaret Daphine Deutscher, FNP

## 2014-04-26 NOTE — Patient Instructions (Signed)
Prostatitis °Prostatitis is redness, soreness, and puffiness (swelling) of the prostate gland. The prostate gland is the walnut-sized gland located just below your bladder. °HOME CARE:  °· Take all medicines as told by your doctor. °· Take warm-water baths (sitz baths) as told by your doctor. °GET HELP IF: °· Your symptoms get worse, not better. °· You have a fever. °GET HELP RIGHT AWAY IF:  °· You have chills. °· You feel sick to your stomach (nauseous) or like you will throw up (vomit). °· You feel lightheaded or like you will pass out (faint). °· You are unable to pee (urinate). °· You have blood or blood clumps (clots) in your pee (urine). °MAKE SURE YOU: °· Understand these instructions. °· Will watch your condition. °· Will get help right away if you are not doing well or get worse. °Document Released: 06/26/2011 Document Revised: 08/27/2012 Document Reviewed: 07/14/2012 °Exit[employer]® Patient Information ©2015 [employer]. This information is not intended to replace advice given to you by your health care provider. Make sure you discuss any questions you have with your health care provider. ° °

## 2014-04-27 ENCOUNTER — Encounter: Payer: Self-pay | Admitting: *Deleted

## 2014-04-27 ENCOUNTER — Other Ambulatory Visit (INDEPENDENT_AMBULATORY_CARE_PROVIDER_SITE_OTHER): Payer: 59 | Admitting: Nurse Practitioner

## 2014-04-27 DIAGNOSIS — N3000 Acute cystitis without hematuria: Secondary | ICD-10-CM

## 2014-04-27 LAB — URINE CULTURE

## 2014-04-27 MED ORDER — CEFTRIAXONE SODIUM 1 G IJ SOLR
1.0000 g | INTRAMUSCULAR | Status: AC
  Administered 2014-04-27: 1 g via INTRAMUSCULAR

## 2014-04-28 ENCOUNTER — Encounter: Payer: Self-pay | Admitting: Physician Assistant

## 2014-04-28 ENCOUNTER — Encounter: Payer: Self-pay | Admitting: *Deleted

## 2014-04-28 ENCOUNTER — Encounter (HOSPITAL_COMMUNITY): Payer: Self-pay | Admitting: General Practice

## 2014-04-28 ENCOUNTER — Observation Stay (HOSPITAL_COMMUNITY)
Admission: AD | Admit: 2014-04-28 | Discharge: 2014-04-29 | Disposition: A | Discharge status: Home / Self Care | Payer: 59 | Source: Ambulatory Visit | Attending: Cardiology | Admitting: Cardiology

## 2014-04-28 ENCOUNTER — Telehealth: Payer: Self-pay | Admitting: Cardiovascular Disease

## 2014-04-28 ENCOUNTER — Ambulatory Visit (HOSPITAL_BASED_OUTPATIENT_CLINIC_OR_DEPARTMENT_OTHER): Payer: 59

## 2014-04-28 ENCOUNTER — Ambulatory Visit (INDEPENDENT_AMBULATORY_CARE_PROVIDER_SITE_OTHER): Payer: 59 | Admitting: Physician Assistant

## 2014-04-28 VITALS — BP 100/70 | HR 69 | Temp 100.5°F | Ht 74.0 in | Wt 244.0 lb

## 2014-04-28 DIAGNOSIS — I5189 Other ill-defined heart diseases: Secondary | ICD-10-CM | POA: Diagnosis not present

## 2014-04-28 DIAGNOSIS — I350 Nonrheumatic aortic (valve) stenosis: Secondary | ICD-10-CM

## 2014-04-28 DIAGNOSIS — A419 Sepsis, unspecified organism: Secondary | ICD-10-CM | POA: Diagnosis not present

## 2014-04-28 DIAGNOSIS — I10 Essential (primary) hypertension: Secondary | ICD-10-CM | POA: Diagnosis not present

## 2014-04-28 DIAGNOSIS — Z954 Presence of other heart-valve replacement: Secondary | ICD-10-CM

## 2014-04-28 DIAGNOSIS — Z789 Other specified health status: Secondary | ICD-10-CM

## 2014-04-28 DIAGNOSIS — I2510 Atherosclerotic heart disease of native coronary artery without angina pectoris: Secondary | ICD-10-CM

## 2014-04-28 DIAGNOSIS — B349 Viral infection, unspecified: Secondary | ICD-10-CM | POA: Diagnosis present

## 2014-04-28 DIAGNOSIS — Z952 Presence of prosthetic heart valve: Secondary | ICD-10-CM

## 2014-04-28 DIAGNOSIS — Z720 Tobacco use: Secondary | ICD-10-CM | POA: Diagnosis not present

## 2014-04-28 DIAGNOSIS — F1010 Alcohol abuse, uncomplicated: Secondary | ICD-10-CM | POA: Insufficient documentation

## 2014-04-28 DIAGNOSIS — Z7982 Long term (current) use of aspirin: Secondary | ICD-10-CM | POA: Insufficient documentation

## 2014-04-28 DIAGNOSIS — F1099 Alcohol use, unspecified with unspecified alcohol-induced disorder: Secondary | ICD-10-CM

## 2014-04-28 DIAGNOSIS — F419 Anxiety disorder, unspecified: Secondary | ICD-10-CM | POA: Diagnosis not present

## 2014-04-28 DIAGNOSIS — Z9089 Acquired absence of other organs: Secondary | ICD-10-CM | POA: Insufficient documentation

## 2014-04-28 DIAGNOSIS — Z79899 Other long term (current) drug therapy: Secondary | ICD-10-CM | POA: Insufficient documentation

## 2014-04-28 DIAGNOSIS — E780 Pure hypercholesterolemia: Secondary | ICD-10-CM | POA: Insufficient documentation

## 2014-04-28 DIAGNOSIS — F17210 Nicotine dependence, cigarettes, uncomplicated: Secondary | ICD-10-CM | POA: Diagnosis not present

## 2014-04-28 DIAGNOSIS — Z7289 Other problems related to lifestyle: Secondary | ICD-10-CM

## 2014-04-28 HISTORY — DX: Alcohol abuse, uncomplicated: F10.10

## 2014-04-28 HISTORY — DX: Pure hypercholesterolemia, unspecified: E78.00

## 2014-04-28 LAB — COMPREHENSIVE METABOLIC PANEL
ALT: 32 U/L (ref 0–53)
ANION GAP: 11 (ref 5–15)
AST: 28 U/L (ref 0–37)
Albumin: 3.7 g/dL (ref 3.5–5.2)
Alkaline Phosphatase: 125 U/L — ABNORMAL HIGH (ref 39–117)
BUN: 19 mg/dL (ref 6–23)
CO2: 22 mmol/L (ref 19–32)
Calcium: 9.1 mg/dL (ref 8.4–10.5)
Chloride: 97 mmol/L (ref 96–112)
Creatinine, Ser: 1.29 mg/dL (ref 0.50–1.35)
GFR, EST AFRICAN AMERICAN: 68 mL/min — AB (ref >90)
GFR, EST NON AFRICAN AMERICAN: 58 mL/min — AB (ref >90)
GLUCOSE: 122 mg/dL — AB (ref 70–99)
Potassium: 4.6 mmol/L (ref 3.5–5.1)
Sodium: 130 mmol/L — ABNORMAL LOW (ref 135–145)
Total Bilirubin: 0.9 mg/dL (ref 0.3–1.2)
Total Protein: 7 g/dL (ref 6.0–8.3)

## 2014-04-28 LAB — CBC WITH DIFFERENTIAL/PLATELET
Basophils Absolute: 0 10*3/uL (ref 0.0–0.1)
Basophils Relative: 0 % (ref 0–1)
Eosinophils Absolute: 0.1 10*3/uL (ref 0.0–0.7)
Eosinophils Relative: 1 % (ref 0–5)
HCT: 39.9 % (ref 39.0–52.0)
Hemoglobin: 13.9 g/dL (ref 13.0–17.0)
LYMPHS ABS: 1.7 10*3/uL (ref 0.7–4.0)
LYMPHS PCT: 23 % (ref 12–46)
MCH: 31.6 pg (ref 26.0–34.0)
MCHC: 34.8 g/dL (ref 30.0–36.0)
MCV: 90.7 fL (ref 78.0–100.0)
Monocytes Absolute: 0.4 10*3/uL (ref 0.1–1.0)
Monocytes Relative: 6 % (ref 3–12)
NEUTROS PCT: 70 % (ref 43–77)
Neutro Abs: 5.1 10*3/uL (ref 1.7–7.7)
Platelets: 210 10*3/uL (ref 150–400)
RBC: 4.4 MIL/uL (ref 4.22–5.81)
RDW: 12.8 % (ref 11.5–15.5)
WBC: 7.3 10*3/uL (ref 4.0–10.5)

## 2014-04-28 LAB — ROCKY MTN SPOTTED FVR ABS PNL(IGG+IGM)
RMSF IGM: 0.38 {index} (ref 0.00–0.89)
RMSF IgG: NEGATIVE

## 2014-04-28 LAB — SEDIMENTATION RATE: SED RATE: 36 mm/h — AB (ref 0–16)

## 2014-04-28 LAB — LYME AB/WESTERN BLOT REFLEX: LYME DISEASE AB, QUANT, IGM: <0.8 index (ref 0.00–0.79)

## 2014-04-28 MED ORDER — VANCOMYCIN HCL IN DEXTROSE 750-5 MG/150ML-% IV SOLN
750.0000 mg | Freq: Two times a day (BID) | INTRAVENOUS | Status: DC
  Administered 2014-04-29: 750 mg via INTRAVENOUS
  Filled 2014-04-28 (×3): qty 150

## 2014-04-28 MED ORDER — VANCOMYCIN HCL 10 G IV SOLR
2500.0000 mg | Freq: Once | INTRAVENOUS | Status: AC
  Administered 2014-04-28: 2500 mg via INTRAVENOUS
  Filled 2014-04-28: qty 2500

## 2014-04-28 MED ORDER — ACETAMINOPHEN 325 MG PO TABS: 650.0000 mg | ORAL_TABLET | ORAL | Status: DC | PRN

## 2014-04-28 MED ORDER — ALPRAZOLAM 0.25 MG PO TABS
0.2500 mg | ORAL_TABLET | Freq: Three times a day (TID) | ORAL | Status: DC | PRN
  Administered 2014-04-28: 0.25 mg via ORAL
  Filled 2014-04-28: qty 1

## 2014-04-28 MED ORDER — ASPIRIN EC 325 MG PO TBEC
325.0000 mg | DELAYED_RELEASE_TABLET | Freq: Every day | ORAL | Status: DC
  Administered 2014-04-28 – 2014-04-29 (×2): 325 mg via ORAL
  Filled 2014-04-28 (×2): qty 1

## 2014-04-28 MED ORDER — SODIUM CHLORIDE 0.9 % IV SOLN: 250.0000 mL | INTRAVENOUS | Status: DC | PRN

## 2014-04-28 MED ORDER — HYDROCODONE-ACETAMINOPHEN 5-325 MG PO TABS
1.0000 | ORAL_TABLET | Freq: Four times a day (QID) | ORAL | Status: DC | PRN
  Administered 2014-04-29 (×2): 1 via ORAL
  Filled 2014-04-28 (×2): qty 1

## 2014-04-28 MED ORDER — ATORVASTATIN CALCIUM 10 MG PO TABS
10.0000 mg | ORAL_TABLET | Freq: Every day | ORAL | Status: DC
  Administered 2014-04-28: 10 mg via ORAL
  Filled 2014-04-28 (×2): qty 1

## 2014-04-28 MED ORDER — SODIUM CHLORIDE 0.9 % IJ SOLN: 3.0000 mL | INTRAMUSCULAR | Status: DC | PRN

## 2014-04-28 MED ORDER — ENOXAPARIN SODIUM 40 MG/0.4ML ~~LOC~~ SOLN
40.0000 mg | SUBCUTANEOUS | Status: DC
  Administered 2014-04-28: 40 mg via SUBCUTANEOUS
  Filled 2014-04-28 (×2): qty 0.4

## 2014-04-28 MED ORDER — NITROGLYCERIN 0.4 MG SL SUBL: 0.4000 mg | SUBLINGUAL_TABLET | SUBLINGUAL | Status: DC | PRN

## 2014-04-28 MED ORDER — ONDANSETRON HCL 4 MG/2ML IJ SOLN: 4.0000 mg | Freq: Four times a day (QID) | INTRAMUSCULAR | Status: DC | PRN

## 2014-04-28 MED ORDER — SODIUM CHLORIDE 0.9 % IJ SOLN: 3.0000 mL | Freq: Two times a day (BID) | INTRAMUSCULAR | Status: DC

## 2014-04-28 NOTE — Telephone Encounter (Signed)
New message      Pt c/o Shortness Of Breath: STAT if SOB developed within the last 24 hours or pt is noticeably SOB on the phone  1. Are you currently SOB (can you hear that pt is SOB on the phone)? Wife on phone 2. How long have you been experiencing SOB? 10 days---- 3. Are you SOB when sitting or when up moving around?moving around  4. Are you currently experiencing any other symptoms? bp 85/70, fever, been to PCP 3 times within 10 days---please advise

## 2014-04-28 NOTE — Patient Instructions (Signed)
Medication Instructions:    Labwork:   Testing/Procedures:   Follow-Up: YOU HAVE BEEN ADMITTED TO [facility]  Any Other Special Instructions Will Be Listed Below (If Applicable).

## 2014-04-28 NOTE — Progress Notes (Signed)
2D Echo completed. 04/28/2014 

## 2014-04-28 NOTE — Telephone Encounter (Signed)
Spoke with pt's wife who report pt developed fever and shortness of breath on April 16, 2014 and was seen by primary care. (note in EPIC). Diagnosed with pneumonia. Wife reports chest X-ray was OK. He was treated with antibiotic. Since diagnosed with blood in urine and placed on another antibiotic. Shortness of breath has not worsened but has not improved. Blood pressure was 85/70 yesterday when in primary care for Rocephin injection. Pt's wife has stopped lisinopril due to low BP.  Pt continues to run fever of 99-101.  I reviewed with Dr. Clifton JamesMcAlhany who would like pt to have office visit and echo.  Appt made for pt to see Tereso NewcomerScott Weaver, PA today with echo at 4:00 today.  I spoke with pt's wife who states pt will be here for these appointments.

## 2014-04-28 NOTE — H&P (Signed)
History and Physical   Date:  04/28/2014   ID:  Benjamin Spears, DOB 1950/12/26, MRN [id number]  PCP:  [name], MD  Cardiologist:  Dr. Verne Carrow     Chief Complaint  Patient presents with  . Fevers, Shortness of Breath, s/p AVR     History of Present Illness: Benjamin Spears is a 64 y.o. male with a hx of tobacco abuse (ongoing), alcohol abuse, thyroid mass s/p thyroidectomy and severe aortic valve stenosis. He underwent bioprosthetic aortic valve replacement with Dr. Donata Clay 10/2013.  Postoperative course was complicated by alcohol withdrawal. He was given beer with 2 meals to help avoid this. He developed atrial fibrillation and converted to NSR on amiodarone.  Amiodarone has since been stopped.  Last seen by Dr. Verne Carrow 12/2013.    Patient recently seen by primary care. Treated empirically with antibiotics for pneumonia. He was subsequently placed on another round of antibiotics for UTI. Blood pressures have been running low (85/70). ACE inhibitor was held. He contacted our office today. He continues to have fevers of 99-101. Dr. Clifton James requested an echocardiogram and an acute appointment today for further evaluation.   He is here with his wife.  He has had fevers as high as 102.9 F over the past 2 weeks.  He has been fatigued and has had myalgias, arthralgias, chills, rigors, HAs, nausea.  He has had some increased urination.  No dysuria, urgency, hesitancy.  He notes DOE.  He is NYHA 2b.  Denies orthopnea, PND, edema.  No chest pain.  No syncope.  BP has been as low as 82/70.  He felt poorly with this.  Lisinopril has been held.    Studies/Reports Reviewed Today:  Dg Chest 2 View  04/16/2014    CLINICAL DATA:  Cough and congestion  EXAM: CHEST  2 VIEW  COMPARISON:  11/25/2013  FINDINGS: Cardiac shadow is stable. Postsurgical changes are again seen. The lungs are clear bilaterally. No acute bony abnormality is seen.  IMPRESSION: No acute  abnormality noted.   Electronically Signed   By: Alcide Clever M.D.   On: 04/16/2014 16:04    Echo 12/14/13 - Moderate LVH. EF 55% to 60%. Wall motion was normal.  Grade 1 diastolic dysfunction.  - Aortic valve: Bioprosthetic aortic valve, not well visualized. No evidence for obstruction, AT <100 ms, DVI <0.25. - Mitral valve: Calcified annulus. There was trivial regurgitation. - Left atrium: Severely dilated at 48 ml/m2. Impressions:  Compared to the prior echo in 06/2013, there is now a normally functioning bioprosthetic aortic valve without obstruction.  LHC (8/15):  Mid left main 10%, mid LAD 20% OM1 20% proximal, mid,distal RCA 20%  Carotid US (10/15):  Bilateral ICA 1-39%   Past Medical History  Diagnosis Date  . Thyroid disease   . Aortic stenosis   . Hypertension     on meds  . Heart murmur   . Cough     for last month  . Shortness of breath   . Anxiety   . Blood dyscrasia     bruises easily  . Hx of echocardiogram     post AVR >> Echo (12/15):  Mod LVH, EF 55-60%, no RWMA, Gr 1 DD, AVR ok, MAC, severe LAE    Past Surgical History  Procedure Laterality Date  . Thyroidectomy, partial    . Fracture surgery    . Wrist fracture surgery Right   . Nasal fracture surgery      as  a child  . Aortic valve replacement N/A 10/23/2013    Procedure: AORTIC VALVE REPLACEMENT (AVR);  Surgeon: Ivin Poot, MD;  Location: [facility];  Service: Open Heart Surgery;  Laterality: N/A;  . Intraoperative transesophageal echocardiogram N/A 10/23/2013    Procedure: INTRAOPERATIVE TRANSESOPHAGEAL ECHOCARDIOGRAM;  Surgeon: Ivin Poot, MD;  Location: [facility];  Service: Open Heart Surgery;  Laterality: N/A;  . Left and right heart catheterization with coronary angiogram N/A 09/04/2013    Procedure: LEFT AND RIGHT HEART CATHETERIZATION WITH CORONARY ANGIOGRAM;  Surgeon: Burnell Blanks, MD;  Location: [facility] CATH LAB;  Service: Cardiovascular;  Laterality: N/A;     Current Outpatient  Prescriptions  Medication Sig Dispense Refill  . ALPRAZolam (XANAX) 0.25 MG tablet Take 1 tablet (0.25 mg total) by mouth 3 (three) times daily as needed for anxiety. 30 tablet 1  . aspirin EC 325 MG EC tablet Take 1 tablet (325 mg total) by mouth daily. 30 tablet 0  . atorvastatin (LIPITOR) 10 MG tablet Take 1 tablet (10 mg total) by mouth at bedtime. 90 tablet 3  . HYDROcodone-acetaminophen (LORTAB) 5-325 MG per tablet Take 1 tablet by mouth every 6 (six) hours as needed for moderate pain. 20 tablet 0  . lisinopril (PRINIVIL,ZESTRIL) 5 MG tablet Take 1 tablet (5 mg total) by mouth daily. 90 tablet 1  . metoprolol tartrate (LOPRESSOR) 25 MG tablet Take 0.5 tablets (12.5 mg total) by mouth 2 (two) times daily. 30 tablet 1  . sulfamethoxazole-trimethoprim (BACTRIM DS) 800-160 MG per tablet Take 1 tablet by mouth 2 (two) times daily. 56 tablet 0   No current facility-administered medications for this visit.    Allergies:   Ambien    Social History:  The patient  reports that he has been smoking Cigarettes.  He has a 80 pack-year smoking history. He has never used smokeless tobacco. He reports that he drinks about 7.2 oz of alcohol per week. He reports that he does not use illicit drugs.   Family History:  The patient's family history includes Atrial fibrillation in his mother; Cancer in his mother; Heart disease in his mother; Heart failure in his daughter. There is no history of Heart attack or Stroke.    ROS:   Please see the history of present illness.   Review of Systems  Constitution: Positive for chills, fever and malaise/fatigue.  HENT: Positive for headaches.   Cardiovascular: Positive for dyspnea on exertion.  Respiratory: Negative for cough.   Skin: Negative for rash.  Musculoskeletal: Positive for back pain, joint pain and myalgias.  Gastrointestinal: Positive for nausea. Negative for vomiting.  All other systems reviewed and are negative.     PHYSICAL EXAM: VS:  BP  100/70 mmHg  Pulse 69  Temp(Src) 100.5 F (38.1 C)  Ht $R'6\' 2"'gN$  (1.88 m)  Wt 244 lb (110.678 kg)  BMI 31.31 kg/m2    Wt Readings from Last 3 Encounters:  04/28/14 244 lb (110.678 kg)  04/26/14 244 lb (110.678 kg)  01/27/14 250 lb (113.399 kg)     GEN: Well nourished, well developed, in no acute distress HEENT: PERRL, fundi clear bilaterally, OP pink without exudate Lymph:  No cervical LAD noted Neck: no JVD,  no masses Cardiac:  Normal S1/S2, RRR; 0-8/6 systolic murmur LSB ,  no rubs or gallops, no edema  Respiratory:  clear to auscultation bilaterally, no wheezing, rhonchi or rales. GI: soft, nontender, nondistended, + BS MS: no deformity or atrophy Skin: warm and dry; no petechiae,  splinter hemorrhages, Janeway lesions, Osler nodes noted  Neuro:  CNs II-XII intact, Strength and sensation are intact Psych: Normal affect   EKG:  EKG is ordered today.  It demonstrates:   NSR, HR 69, normal axis, anterior Q waves, no change from prior tracing   Recent Labs: 05/27/2013: TSH 1.880 10/20/2013: ALT 28 10/24/2013: Magnesium 2.1 10/27/2013: BUN 19; Creatinine 0.87; Platelets 182; Potassium 4.0; Sodium 134* 04/26/2014: Hemoglobin 13.9*    Recent Labs  04/26/14 0913  INFLUAPOC Negative  INFLUBPOC Negative     Recent Labs  04/26/14 0913  WBC 12.5*  POCLYMPH 1.3  POCLYMPHPERC 10.1  POCGRAN 11.1*  RBC 4.81  HGB 13.9*  HCT 45.3  POCPLA 263    Recent Labs  04/26/14 0913  COLORU orange  CLARITYU clear  GLUCOSEUR neg  BILIRUBINUR large  KETONESU trace  SPECGRAV 1.015  PHUR 6.0  PROTEINUR trace  UROBILINOGEN negative  NITRITE neg  LEUKOCYTESUR moderate (2+)    Recent Labs  04/26/14 0913  WBCU 20-25  RBCU 5-10  BACTERIA neg  MUCUS moderate  EPIU neg  CRYST neg  LABCAST neg  YEAST neg     Lipid Panel    Component Value Date/Time   CHOL 186 05/27/2013 1020   TRIG 151* 05/27/2013 1020   HDL 57 05/27/2013 1020   CHOLHDL 3.3 05/27/2013 1020   LDLCALC  99 05/27/2013 1020      ASSESSMENT AND PLAN:  Sepsis, due to unspecified organism He has symptoms that are concerning for sepsis.  He has had high fevers. He has been hypotensive.  He describes chills/rigors.  Recent CBC with high WBC and left shift.  CXR recently was clear.  UA with + blood but no bacteria and culture without significant growth.  He has a murmur on exam but this has been documented previously.  We brought him in today and scheduled an echo later today.  He does not have any vascular phenomena.  He is not an IV drug abuser.  However, we are certainly concerned about infective endocarditis.  Dr. Ron Parker has also seen the patient today.  We have recommended admission to [facility] for IV antibiotics.  I reviewed this with Benjamin Spears, PharmD regarding appropriate choice of empiric antibiotics.    -  Draw Blood Cultures x 2 full sets today.  -  Start IV Vancomycin AFTER blood cultures drawn.  -  Labs:  CMET, CBC with diff, ESR, CRP.  -  Consult ID.  Aortic stenosis S/P AVR (aortic valve replacement) As noted, will need to work up for possible infective endocarditis.  Echo will be obtained in our office today as planned.  He will be admitted to the hospital after we have a chance to look at his echo.   Essential hypertension BP running low.  Hold ACE inhibitor as noted.  Will hold beta-blocker as well.  Coronary artery disease  No angina. Continue ASA, statin.  Tobacco abuse Cessation has been recommended in the past.  Alcohol use  He has cut back on his drinking but admits to 3-4 beers 4-5 days a week.  Will need to cover with CIWA protocol.  Current medicines are reviewed at length with the patient today.  Concerns regarding medicines are as outlined above.  The following changes have been made:    Antibiotics will be administered in the hospital   Labs/ tests ordered today include:  Orders Placed This Encounter  Procedures  . EKG 12-Lead    Disposition:   Admit to  [facility] today.  Will need ID to see him to help in management.   Signed, Versie Starks, MHS 04/28/2014 3:24 PM    [facility] [place], [place], [place]  [address] Phone: [phone]; Fax: [phone]   Patient seen and examined. I agree with the assessment and plan as detailed above. See also my additional thoughts below.   The patient is status post aortic valve replacement. He's had continued fevers. Clinically there is concern for the possibility of endocarditis. I reviewed all of the information with Benjamin Spears. I agree that he should be admitted for blood cultures and IV antibiotics and assessment by the infectious disease team.  Dola Argyle, MD, [facility] 04/29/2014 7:37 AM

## 2014-04-28 NOTE — Progress Notes (Signed)
Cardiology Office Note   Date:  04/28/2014   ID:  VIN YONKE, DOB Apr 16, 1950, MRN [id number]  PCP:  Redge Gainer, MD  Cardiologist:  Dr. Lauree Chandler     Chief Complaint  Patient presents with  . Fevers, Shortness of Breath, s/p AVR     History of Present Illness: Benjamin Spears is a 64 y.o. male with a hx of tobacco abuse (ongoing), alcohol abuse, thyroid mass s/p thyroidectomy and severe aortic valve stenosis. He underwent bioprosthetic aortic valve replacement with Dr. Prescott Gum 10/2013.  Postoperative course was complicated by alcohol withdrawal. He was given beer with 2 meals to help avoid this. He developed atrial fibrillation and converted to NSR on amiodarone.  Amiodarone has since been stopped.  Last seen by Dr. Lauree Chandler 12/2013.    Patient recently seen by primary care. Treated empirically with antibiotics for pneumonia. He was subsequently placed on another round of antibiotics for UTI. Blood pressures have been running low (85/70). ACE inhibitor was held. He contacted our office today. He continues to have fevers of 99-101. Dr. Angelena Form requested an echocardiogram and an acute appointment today for further evaluation.   He is here with his wife.  He has had fevers as high as 102.9 F over the past 2 weeks.  He has been fatigued and has had myalgias, arthralgias, chills, rigors, HAs, nausea.  He has had some increased urination.  No dysuria, urgency, hesitancy.  He notes DOE.  He is NYHA 2b.  Denies orthopnea, PND, edema.  No chest pain.  No syncope.  BP has been as low as 82/70.  He felt poorly with this.  Lisinopril has been held.    Studies/Reports Reviewed Today:  Dg Chest 2 View  04/16/2014    CLINICAL DATA:  Cough and congestion  EXAM: CHEST  2 VIEW  COMPARISON:  11/25/2013  FINDINGS: Cardiac shadow is stable. Postsurgical changes are again seen. The lungs are clear bilaterally. No acute bony abnormality is seen.  IMPRESSION: No acute  abnormality noted.   Electronically Signed   By: Inez Catalina M.D.   On: 04/16/2014 16:04    Echo 12/14/13 - Moderate LVH. EF 55% to 60%. Wall motion was normal.  Grade 1 diastolic dysfunction.  - Aortic valve: Bioprosthetic aortic valve, not well visualized. No evidence for obstruction, AT <100 ms, DVI <0.25. - Mitral valve: Calcified annulus. There was trivial regurgitation. - Left atrium: Severely dilated at 48 ml/m2. Impressions:  Compared to the prior echo in 06/2013, there is now a normally functioning bioprosthetic aortic valve without obstruction.  LHC (8/15):  Mid left main 10%, mid LAD 20% OM1 20% proximal, mid,distal RCA 20%  Carotid US (10/15):  Bilateral ICA 1-39%   Past Medical History  Diagnosis Date  . Thyroid disease   . Aortic stenosis   . Hypertension     on meds  . Heart murmur   . Cough     for last month  . Shortness of breath   . Anxiety   . Blood dyscrasia     bruises easily  . Hx of echocardiogram     post AVR >> Echo (12/15):  Mod LVH, EF 55-60%, no RWMA, Gr 1 DD, AVR ok, MAC, severe LAE    Past Surgical History  Procedure Laterality Date  . Thyroidectomy, partial    . Fracture surgery    . Wrist fracture surgery Right   . Nasal fracture surgery      as a  child  . Aortic valve replacement N/A 10/23/2013    Procedure: AORTIC VALVE REPLACEMENT (AVR);  Surgeon: Ivin Poot, MD;  Location: [facility];  Service: Open Heart Surgery;  Laterality: N/A;  . Intraoperative transesophageal echocardiogram N/A 10/23/2013    Procedure: INTRAOPERATIVE TRANSESOPHAGEAL ECHOCARDIOGRAM;  Surgeon: Ivin Poot, MD;  Location: [facility];  Service: Open Heart Surgery;  Laterality: N/A;  . Left and right heart catheterization with coronary angiogram N/A 09/04/2013    Procedure: LEFT AND RIGHT HEART CATHETERIZATION WITH CORONARY ANGIOGRAM;  Surgeon: Burnell Blanks, MD;  Location: [facility] CATH LAB;  Service: Cardiovascular;  Laterality: N/A;     Current Outpatient  Prescriptions  Medication Sig Dispense Refill  . ALPRAZolam (XANAX) 0.25 MG tablet Take 1 tablet (0.25 mg total) by mouth 3 (three) times daily as needed for anxiety. 30 tablet 1  . aspirin EC 325 MG EC tablet Take 1 tablet (325 mg total) by mouth daily. 30 tablet 0  . atorvastatin (LIPITOR) 10 MG tablet Take 1 tablet (10 mg total) by mouth at bedtime. 90 tablet 3  . HYDROcodone-acetaminophen (LORTAB) 5-325 MG per tablet Take 1 tablet by mouth every 6 (six) hours as needed for moderate pain. 20 tablet 0  . lisinopril (PRINIVIL,ZESTRIL) 5 MG tablet Take 1 tablet (5 mg total) by mouth daily. 90 tablet 1  . metoprolol tartrate (LOPRESSOR) 25 MG tablet Take 0.5 tablets (12.5 mg total) by mouth 2 (two) times daily. 30 tablet 1  . sulfamethoxazole-trimethoprim (BACTRIM DS) 800-160 MG per tablet Take 1 tablet by mouth 2 (two) times daily. 56 tablet 0   No current facility-administered medications for this visit.    Allergies:   Ambien    Social History:  The patient  reports that he has been smoking Cigarettes.  He has a 80 pack-year smoking history. He has never used smokeless tobacco. He reports that he drinks about 7.2 oz of alcohol per week. He reports that he does not use illicit drugs.   Family History:  The patient's family history includes Atrial fibrillation in his mother; Cancer in his mother; Heart disease in his mother; Heart failure in his daughter. There is no history of Heart attack or Stroke.    ROS:   Please see the history of present illness.   Review of Systems  Constitution: Positive for chills, fever and malaise/fatigue.  HENT: Positive for headaches.   Cardiovascular: Positive for dyspnea on exertion.  Respiratory: Negative for cough.   Skin: Negative for rash.  Musculoskeletal: Positive for back pain, joint pain and myalgias.  Gastrointestinal: Positive for nausea. Negative for vomiting.  All other systems reviewed and are negative.     PHYSICAL EXAM: VS:  BP  100/70 mmHg  Pulse 69  Temp(Src) 100.5 F (38.1 C)  Ht _0  (1.88 m)  Wt 244 lb (110.678 kg)  BMI 31.31 kg/m2    Wt Readings from Last 3 Encounters:  04/28/14 244 lb (110.678 kg)  04/26/14 244 lb (110.678 kg)  01/27/14 250 lb (113.399 kg)     GEN: Well nourished, well developed, in no acute distress HEENT: PERRL, fundi clear bilaterally, OP pink without exudate Lymph:  No cervical LAD noted Neck: no JVD,  no masses Cardiac:  Normal S1/S2, RRR; 6-5/4 systolic murmur LSB ,  no rubs or gallops, no edema  Respiratory:  clear to auscultation bilaterally, no wheezing, rhonchi or rales. GI: soft, nontender, nondistended, + BS MS: no deformity or atrophy Skin: warm and dry; no petechiae, splinter  hemorrhages, Janeway lesions, Osler nodes noted  Neuro:  CNs II-XII intact, Strength and sensation are intact Psych: Normal affect   EKG:  EKG is ordered today.  It demonstrates:   NSR, HR 69, normal axis, anterior Q waves, no change from prior tracing   Recent Labs: 05/27/2013: TSH 1.880 10/20/2013: ALT 28 10/24/2013: Magnesium 2.1 10/27/2013: BUN 19; Creatinine 0.87; Platelets 182; Potassium 4.0; Sodium 134* 04/26/2014: Hemoglobin 13.9*    Recent Labs  04/26/14 0913  INFLUAPOC Negative  INFLUBPOC Negative     Recent Labs  04/26/14 0913  WBC 12.5*  POCLYMPH 1.3  POCLYMPHPERC 10.1  POCGRAN 11.1*  RBC 4.81  HGB 13.9*  HCT 45.3  POCPLA 263    Recent Labs  04/26/14 0913  COLORU orange  CLARITYU clear  GLUCOSEUR neg  BILIRUBINUR large  KETONESU trace  SPECGRAV 1.015  PHUR 6.0  PROTEINUR trace  UROBILINOGEN negative  NITRITE neg  LEUKOCYTESUR moderate (2+)    Recent Labs  04/26/14 0913  WBCU 20-25  RBCU 5-10  BACTERIA neg  MUCUS moderate  EPIU neg  CRYST neg  LABCAST neg  YEAST neg     Lipid Panel    Component Value Date/Time   CHOL 186 05/27/2013 1020   TRIG 151* 05/27/2013 1020   HDL 57 05/27/2013 1020   CHOLHDL 3.3 05/27/2013 1020   LDLCALC  99 05/27/2013 1020      ASSESSMENT AND PLAN:  Sepsis, due to unspecified organism He has symptoms that are concerning for sepsis.  He has had high fevers. He has been hypotensive.  He describes chills/rigors.  Recent CBC with high WBC and left shift.  CXR recently was clear.  UA with + blood but no bacteria and culture without significant growth.  He has a murmur on exam but this has been documented previously.  We brought him in today and scheduled an echo later today.  He does not have any vascular phenomena.  He is not an IV drug abuser.  However, we are certainly concerned about infective endocarditis.  Dr. Ron Parker has also seen the patient today.  We have recommended admission to [facility] for IV antibiotics.  I reviewed this with Elberta Leatherwood, PharmD regarding appropriate choice of empiric antibiotics.    -  Draw Blood Cultures x 2 full sets today.  -  Start IV Vancomycin AFTER blood cultures drawn.  -  Labs:  CMET, CBC with diff, ESR, CRP.  -  Consult ID.  Aortic stenosis S/P AVR (aortic valve replacement) As noted, will need to work up for possible infective endocarditis.  Echo will be obtained in our office today as planned.  He will be admitted to the hospital after we have a chance to look at his echo.   Essential hypertension BP running low.  Hold ACE inhibitor as noted.  Will hold beta-blocker as well.  Coronary artery disease  No angina. Continue ASA, statin.  Tobacco abuse Cessation has been recommended in the past.  Alcohol use  He has cut back on his drinking but admits to 3-4 beers 4-5 days a week.  Will need to cover with CIWA protocol.  Current medicines are reviewed at length with the patient today.  Concerns regarding medicines are as outlined above.  The following changes have been made:    Antibiotics will be administered in the hospital   Labs/ tests ordered today include:  Orders Placed This Encounter  Procedures  . EKG 12-Lead    Disposition:   Admit to [facility] today.  Will need ID to see him to help in management.   Signed, Versie Starks, MHS 04/28/2014 3:24 PM    [facility] [place], [place], [place]  [address] Phone: [phone]; Fax: [phone]

## 2014-04-28 NOTE — Progress Notes (Signed)
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 Ambien [Zolpidem]     Patient Measurements: Height: 6\' 2"  (188 cm) Weight: 239 lb 14.4 oz (108.818 kg) IBW/kg (Calculated) : 82.2   Vital Signs: Temp: 100.5 F (38.1 C) (04/20 1415) BP: 100/70 mmHg (04/20 1415) Pulse Rate: 69 (04/20 1415) Intake/Output from previous day:   Intake/Output from this shift:    Labs:  Recent Labs  04/26/14 0913  WBC 12.5*  HGB 13.9*   CrCl cannot be calculated (Patient has no serum creatinine result on file.). No results for input(s): VANCOTROUGH, VANCOPEAK, VANCORANDOM, GENTTROUGH, GENTPEAK, GENTRANDOM, TOBRATROUGH, TOBRAPEAK, TOBRARND, AMIKACINPEAK, AMIKACINTROU, AMIKACIN in the last 72 hours.   Microbiology: Recent Results (from the past 720 hour(s))  Urine culture     Status: None   Collection Time: 04/26/14  9:17 AM  Result Value Ref Range Status   Urine Culture, Routine Final report  Final   Result 1 Comment  Final    Comment: Culture shows less than 10,000 colony forming units of bacteria per milliliter of urine. This colony count is not generally considered to be clinically significant.     Medical History: Past Medical History  Diagnosis Date  . Thyroid disease   . Aortic stenosis   . Hypertension     on meds  . Heart murmur   . Cough     for last month  . Shortness of breath   . Anxiety   . Blood dyscrasia     bruises easily  . Hx of echocardiogram     post AVR >> Echo (12/15):  Mod LVH, EF 55-60%, no RWMA, Gr 1 DD, AVR ok, MAC, severe LAE    Medications:  Prescriptions prior to admission  Medication Sig Dispense Refill Last Dose  . ALPRAZolam (XANAX) 0.25 MG tablet Take 1 tablet (0.25 mg total) by mouth 3 (three) times daily as needed for anxiety. 30 tablet 1 Taking  . aspirin EC 325 MG EC tablet Take 1 tablet (325 mg total) by mouth daily. 30 tablet 0  Taking  . atorvastatin (LIPITOR) 10 MG tablet Take 1 tablet (10 mg total) by mouth at bedtime. 90 tablet 3 Taking  . HYDROcodone-acetaminophen (LORTAB) 5-325 MG per tablet Take 1 tablet by mouth every 6 (six) hours as needed for moderate pain. 20 tablet 0 Taking  . lisinopril (PRINIVIL,ZESTRIL) 5 MG tablet Take 1 tablet (5 mg total) by mouth daily. 90 tablet 1 Taking  . metoprolol tartrate (LOPRESSOR) 25 MG tablet Take 0.5 tablets (12.5 mg total) by mouth 2 (two) times daily. 30 tablet 1 Taking  . [DISCONTINUED] sulfamethoxazole-trimethoprim (BACTRIM DS) 800-160 MG per tablet Take 1 tablet by mouth 2 (two) times daily. 56 tablet 0 Taking   Assessment: 64 year old man who has been been ill for at least two weeks with fevers as high as 102.9.  Vancomycin to start empirically for possible prosthetic valve endocarditis.    Goal of Therapy:  Vancomycin trough level 15-20 mcg/ml  Plan:  Measure antibiotic drug levels at steady state Follow up culture results Vancomycin 2.5g IV x 1, then 750mg  IV q12h  Monitor renal function  Mickeal SkinnerFrens, Tysheena Ginzburg John 04/28/2014,5:52 PM

## 2014-04-29 DIAGNOSIS — Z954 Presence of other heart-valve replacement: Secondary | ICD-10-CM | POA: Diagnosis not present

## 2014-04-29 DIAGNOSIS — R509 Fever, unspecified: Secondary | ICD-10-CM | POA: Diagnosis not present

## 2014-04-29 DIAGNOSIS — I25810 Atherosclerosis of coronary artery bypass graft(s) without angina pectoris: Secondary | ICD-10-CM | POA: Diagnosis not present

## 2014-04-29 DIAGNOSIS — B349 Viral infection, unspecified: Secondary | ICD-10-CM

## 2014-04-29 LAB — C-REACTIVE PROTEIN: CRP: 6.4 mg/dL — ABNORMAL HIGH (ref <0.60)

## 2014-04-29 NOTE — Progress Notes (Signed)
Subjective:  Patient feels weak  Says hard to describe   Objective: Filed Vitals:   04/28/14 1751 04/28/14 1953 04/29/14 0402  BP: 112/86 116/75 108/85  Pulse: 82 71 63  Temp: 98.4 F (36.9 C) 98.1 F (36.7 C) 98 F (36.7 C)  TempSrc: Oral Oral Oral  Resp:  16 17  Height:  (1.88 m)    Weight: 239 lb 14.4 oz (108.818 kg)  240 lb 11.2 oz (109.181 kg)  SpO2: 100% 98% 98%   Weight change:   Intake/Output Summary (Last 24 hours) at 04/29/14 0804 Last data filed at 04/29/14 0404  Gross per 24 hour  Intake      0 ml  Output   1000 ml  Net  -1000 ml    General: Alert, awake, oriented x3, in no acute distress Neck:  JVP is normal Heart: Regular rate and rhythm,  II/VI systolic murmur LSB  rubs, gallops.  Lungs: Clear to auscultation.  No rales or wheezes. Exemities:  No edema.   Skin:  No lesions.   Neuro: Grossly intact, nonfocal.  TEeLe:  SR   Lab Results: Results for orders placed or performed during the hospital encounter of 04/28/14 (from the past 24 hour(s))  Comprehensive metabolic panel     Status: Abnormal   Collection Time: 04/28/14  6:39 PM  Result Value Ref Range   Sodium 130 (L) 135 - 145 mmol/L   Potassium 4.6 3.5 - 5.1 mmol/L   Chloride 97 96 - 112 mmol/L   CO2 22 19 - 32 mmol/L   Glucose, Bld 122 (H) 70 - 99 mg/dL   BUN 19 6 - 23 mg/dL   Creatinine, Ser 1.61 0.50 - 1.35 mg/dL   Calcium 9.1 8.4 - 09.6 mg/dL   Total Protein 7.0 6.0 - 8.3 g/dL   Albumin 3.7 3.5 - 5.2 g/dL   AST 28 0 - 37 U/L   ALT 32 0 - 53 U/L   Alkaline Phosphatase 125 (H) 39 - 117 U/L   Total Bilirubin 0.9 0.3 - 1.2 mg/dL   GFR calc non Af Amer 58 (L) >90 mL/min   GFR calc Af Amer 68 (L) >90 mL/min   Anion gap 11 5 - 15  CBC WITH DIFFERENTIAL     Status: None   Collection Time: 04/28/14  6:39 PM  Result Value Ref Range   WBC 7.3 4.0 - 10.5 K/uL   RBC 4.40 4.22 - 5.81 MIL/uL   Hemoglobin 13.9 13.0 - 17.0 g/dL   HCT 04.5 40.9 - 81.1 %   MCV 90.7 78.0 - 100.0 fL   MCH  31.6 26.0 - 34.0 pg   MCHC 34.8 30.0 - 36.0 g/dL   RDW 91.4 78.2 - 95.6 %   Platelets 210 150 - 400 K/uL   Neutrophils Relative % 70 43 - 77 %   Neutro Abs 5.1 1.7 - 7.7 K/uL   Lymphocytes Relative 23 12 - 46 %   Lymphs Abs 1.7 0.7 - 4.0 K/uL   Monocytes Relative 6 3 - 12 %   Monocytes Absolute 0.4 0.1 - 1.0 K/uL   Eosinophils Relative 1 0 - 5 %   Eosinophils Absolute 0.1 0.0 - 0.7 K/uL   Basophils Relative 0 0 - 1 %   Basophils Absolute 0.0 0.0 - 0.1 K/uL  Sedimentation rate     Status: Abnormal   Collection Time: 04/28/14  6:39 PM  Result Value Ref Range   Sed Rate 36 (H) 0 -  16 mm/hr  C-reactive protein     Status: Abnormal   Collection Time: 04/28/14  6:39 PM  Result Value Ref Range   CRP 6.4 (H) <0.60 mg/dL    Studies/Results: No results found.  Medications: REviewed  @PROBHOSP @\  1  Fever  Afebrile this AM  WBC improved.  Cult pending.  We have asked ID to see patient for assist in W?U   ON vancomycin now.    2.  AS  S/p AVR.  TTE done  I have tentatively put on sched for tomorrow for TEE if ID recommends    3.  HTN  Adequate  4  CAD No symtoms of angina    5  EtOH   Hx of withdrawal in past  Will need to follow.      Dietrich Patesaula Sha Burling 04/29/2014, 8:04 AM

## 2014-04-29 NOTE — Discharge Summary (Signed)
CARDIOLOGY DISCHARGE SUMMARY   Patient ID: Benjamin Spears MRN: [id number] DOB/AGE: 09-19-1950 64 y.o.  Admit date: 04/28/2014 Discharge date: 04/29/2014  PCP: [name], MD Primary Cardiologist: Dr. Clifton James  Primary Discharge Diagnosis:  Sepsis, concern for endocarditis - patient seen by ID and felt to have a viral illness  Secondary Discharge Diagnosis:  S/p bioprosthetic aortic valve replacement  Consults: Infectious Disease  Procedures: Chest x-ray, 2D echocardiogram  Hospital Course: Benjamin Spears is a 64 y.o. male with a history of ETOH, tob use, thyroidectomy, severe AS and bioprosthetic aortic valve replacement less than one year ago. He was seen by his primary care physician on 04/08 and 04/18 for the symptoms. He completed a Z-Pak and also took Tamiflu. No infectious source was found. He continued to have fevers and came to the emergency room on 04/20. He was also having problems with hypotension. He was admitted for further evaluation and treatment.  His white count was initially elevated and he was started on IV antibiotics. Blood cultures were drawn and are pending at the time of dictation. A sedimentation rate and CRP were elevated. An infectious disease consult was called.  He was seen by Dr. Luciana Axe who reviewed previous lab work which showed no obvious source of infection. His white count had improved overnight and he was now afebrile. Tmax was 100.5. He was eating and drinking normally, his hypotension had resolved. An echo was performed, results below. His bioprosthetic aortic valve is functioning normally and there was no obvious abnormality seen.  Dr. Luciana Axe felt that this was most likely a viral illness. Antibiotics were discontinued and he is to be observed off the antibiotics. Unless blood cultures come back positive, a TEE is not indicated at this time. His blood pressure has improved and his metoprolol will be restarted. He will remain off the  lisinopril for now. No further inpatient workup is indicated and he is considered stable for discharge, to follow up as an outpatient.  Labs:   Lab Results  Component Value Date   WBC 7.3 04/28/2014   HGB 13.9 04/28/2014   HCT 39.9 04/28/2014   MCV 90.7 04/28/2014   PLT 210 04/28/2014     Recent Labs Lab 04/28/14 1839  NA 130*  K 4.6  CL 97  CO2 22  BUN 19  CREATININE 1.29  CALCIUM 9.1  PROT 7.0  BILITOT 0.9  ALKPHOS 125*  ALT 32  AST 28  GLUCOSE 122*     2-D echocardiogram: 04/28/2014 Conclusions - Left ventricle: The cavity size was normal. Wall thickness was normal. Systolic function was normal. The estimated ejection fraction was in the range of 60% to 65%. Wall motion was normal; there were no regional wall motion abnormalities. Left ventricular diastolic function parameters were normal. - Ventricular septum: Septal motion showed paradox. These changes are consistent with a post-thoracotomy state. - Aortic valve: A bioprosthesis was present and functioning normally. - Left atrium: The atrium was mildly dilated.  Radiology: Dg Chest 2 View 04/16/2014   CLINICAL DATA:  Cough and congestion  EXAM: CHEST  2 VIEW  COMPARISON:  11/25/2013  FINDINGS: Cardiac shadow is stable. Postsurgical changes are again seen. The lungs are clear bilaterally. No acute bony abnormality is seen.  IMPRESSION: No acute abnormality noted.   Electronically Signed   By: Alcide Clever M.D.   On: 04/16/2014 16:04    FOLLOW UP PLANS AND APPOINTMENTS Allergies  Allergen Reactions  . Ambien [Zolpidem] Other (See Comments)  Pt states makes him wild     Medication List    STOP taking these medications        lisinopril 5 MG tablet  Commonly known as:  PRINIVIL,ZESTRIL     sulfamethoxazole-trimethoprim 800-160 MG per tablet  Commonly known as:  BACTRIM DS,SEPTRA DS      TAKE these medications        acetaminophen 500 MG tablet  Commonly known as:  TYLENOL  Take 500 mg  by mouth every 6 (six) hours as needed for mild pain or fever.     ALPRAZolam 0.25 MG tablet  Commonly known as:  XANAX  Take 1 tablet (0.25 mg total) by mouth 3 (three) times daily as needed for anxiety.     aspirin 325 MG EC tablet  Take 1 tablet (325 mg total) by mouth daily.     atorvastatin 10 MG tablet  Commonly known as:  LIPITOR  Take 1 tablet (10 mg total) by mouth at bedtime.     HYDROcodone-acetaminophen 5-325 MG per tablet  Commonly known as:  LORTAB  Take 1 tablet by mouth every 6 (six) hours as needed for moderate pain.     ibuprofen 200 MG tablet  Commonly known as:  ADVIL,MOTRIN  Take 200 mg by mouth every 6 (six) hours as needed for fever.     metoprolol tartrate 25 MG tablet  Commonly known as:  LOPRESSOR  Take 0.5 tablets (12.5 mg total) by mouth 2 (two) times daily.         Follow-up Information    Follow up with Verne CarrowMCALHANY,CHRISTOPHER, MD.   Specialty:  Cardiology   Why:  The office will call.   Contact information:   [address] [place] [address] [phone]       BRING ALL MEDICATIONS WITH YOU TO FOLLOW UP APPOINTMENTS  Time spent with patient to include physician time: 41 min Signed: Theodore Demarkhonda Barrett, PA-C 04/29/2014, 2:31 PM Co-Sign MD

## 2014-04-29 NOTE — Progress Notes (Signed)
UR completed 

## 2014-04-29 NOTE — Progress Notes (Signed)
04/29/2014 4:28 PM Discharge AVS meds taken today and those due this evening reviewed.  Follow-up appointments and when to call md reviewed.  D/C IV and TELE.  Questions and concerns addressed.   D/C home per orders.Kathryne HitchAllen, Antoria Lanza C

## 2014-04-29 NOTE — Consult Note (Signed)
[facility]     Reason for Consult: fever    Referring Physician: Dr. Myrtis SerKatz  Active Problems:   Sepsis   . aspirin EC  325 mg Oral Daily  . atorvastatin  10 mg Oral QHS  . enoxaparin (LOVENOX) injection  40 mg Subcutaneous Q24H  . sodium chloride  3 mL Intravenous Q12H    Recommendations: Stop vancomycin and observe off of antibiotics   I do not feel endocarditis is likely and do not feel TEE is indicated at this time  Assessment: He has a viral syndrome.  He has had high fever, up to 102, muscle aches, weakness and fatigue consistent with ilnfluenza-like illness, resolving.    Antibiotics: Vancomcycin; has had azithromycin, bactrim, one dose of ceftriaxone  HPI: Benjamin Spears is a 64 y.o. 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  He reports a history of 2 weeks of fever, myalgia, some sob not associated with cough or congestion. Fever up to 102 about every other day but fever curve has been lowering.  Temp yesterday to 100.5.  Overall feels he is getting better, though slowly.  No know sick contacts but is around kids at church.  Has been tested for RMSF, Lyme disease, flu and all negative.  No flu shot listed.  Initially treated with azithromycin for pneumonia, then Bactrim and ceftriaxone for UTI.  Had some frequency of urination for a few days but not now.  Poor po initially and did have a low bp but has been better.  Eating now, drinking liquids.  WBC up to 12.5 4/18, now normal.  Blood cultures sent.    Review of Systems: A comprehensive review of systems was negative.  Past Medical History  Diagnosis Date  . Thyroid disease   . Aortic stenosis   . Hypertension     on meds  . Heart murmur   . Cough     for last month  . Shortness of breath   . Anxiety   . Blood dyscrasia     bruises easily   . Hx of echocardiogram     post AVR >> Echo (12/15):  Mod LVH, EF 55-60%, no RWMA, Gr 1 DD, AVR ok, MAC, severe LAE  . High cholesterol   . Alcohol abuse     10/2013. Postoperative course was complicated by alcohol withdrawal/notes 04/28/2014    History  Substance Use Topics  . Smoking status: Current Every Day Smoker -- 1.00 packs/day for 40 years    Types: Cigarettes  . Smokeless tobacco: Never Used  . Alcohol Use: 7.2 oz/week    12 Cans of beer per week    Family History  Problem Relation Age of Onset  . Heart disease Mother     CHF  . Atrial fibrillation Mother   . Cancer Mother     colon  . Heart attack Neg Hx   . Stroke Neg Hx   . Heart failure Daughter    Allergies  Allergen Reactions  . Ambien [Zolpidem] Other (See Comments)    Pt states makes him wild    OBJECTIVE: Blood pressure 108/85, pulse 63, temperature 98 F (36.7 C), temperature source Oral, resp. rate 17, height 6\' 2"  (1.88 m), weight 240 lb 11.2 oz (109.181 kg), SpO2 98 %. General: awake, alert, nad, not ill appearing Skin: no rashes, no Olser  nodes Lungs: CTA B Cor: RRR without m Abdomen: soft, nt, nd Ext: no edema  Microbiology: Recent Results (from the past 240 hour(s))  Urine culture     Status: None   Collection Time: 04/26/14  9:17 AM  Result Value Ref Range Status   Urine Culture, Routine Final report  Final   Result 1 Comment  Final    Comment: Culture shows less than 10,000 colony forming units of bacteria per milliliter of urine. This colony count is not generally considered to be clinically significant.     Staci Righter, MD [email or web address] [phone] pager  [phone] cell 04/29/2014, 12:49 PM

## 2014-05-05 ENCOUNTER — Other Ambulatory Visit: Payer: Self-pay

## 2014-05-05 ENCOUNTER — Telehealth: Payer: Self-pay | Admitting: *Deleted

## 2014-05-05 LAB — CULTURE, BLOOD (ROUTINE X 2)
Culture: NO GROWTH
Culture: NO GROWTH

## 2014-05-05 MED ORDER — METOPROLOL TARTRATE 25 MG PO TABS: 12.5000 mg | ORAL_TABLET | Freq: Two times a day (BID) | ORAL | Status: AC

## 2014-05-05 NOTE — Telephone Encounter (Signed)
pt notified of final report in the blood cultures that there is no growth. Pt verbalized understanding to results given today.

## 2014-06-02 ENCOUNTER — Ambulatory Visit: Payer: 59 | Admitting: Nurse Practitioner

## 2014-07-09 DEATH — deceased
